# Patient Record
Sex: Female | Born: 1975 | Race: White | Hispanic: No | Marital: Married | Smoking: Former smoker
Health system: Southern US, Community
[De-identification: ages and names within clinical notes are randomized; demographics above are authoritative.]

## PROBLEM LIST (undated history)

## (undated) DIAGNOSIS — D689 Coagulation defect, unspecified: Secondary | ICD-10-CM

## (undated) DIAGNOSIS — B999 Unspecified infectious disease: Secondary | ICD-10-CM

## (undated) DIAGNOSIS — D6851 Activated protein C resistance: Secondary | ICD-10-CM

## (undated) DIAGNOSIS — R51 Headache: Secondary | ICD-10-CM

## (undated) DIAGNOSIS — D333 Benign neoplasm of cranial nerves: Secondary | ICD-10-CM

## (undated) DIAGNOSIS — J45909 Unspecified asthma, uncomplicated: Secondary | ICD-10-CM

## (undated) HISTORY — DX: Coagulation defect, unspecified: D68.9

## (undated) HISTORY — PX: CRANIECTOMY FOR EXCISION OF ACOUSTIC NEUROMA: SUR324

## (undated) HISTORY — DX: Unspecified asthma, uncomplicated: J45.909

## (undated) HISTORY — PX: ANTERIOR CRUCIATE LIGAMENT REPAIR: SHX115

## (undated) HISTORY — PX: BREAST BIOPSY: SHX20

---

## 2005-03-30 ENCOUNTER — Encounter: Payer: Self-pay | Admitting: Internal Medicine

## 2006-12-15 ENCOUNTER — Ambulatory Visit: Payer: Self-pay | Admitting: Internal Medicine

## 2007-02-02 ENCOUNTER — Ambulatory Visit: Payer: Self-pay | Admitting: Sports Medicine

## 2007-03-25 ENCOUNTER — Ambulatory Visit: Payer: Self-pay | Admitting: Sports Medicine

## 2007-03-25 DIAGNOSIS — M7750 Other enthesopathy of unspecified foot: Secondary | ICD-10-CM | POA: Insufficient documentation

## 2007-03-25 DIAGNOSIS — M2140 Flat foot [pes planus] (acquired), unspecified foot: Secondary | ICD-10-CM | POA: Insufficient documentation

## 2007-03-25 DIAGNOSIS — M2020 Hallux rigidus, unspecified foot: Secondary | ICD-10-CM

## 2007-04-26 ENCOUNTER — Other Ambulatory Visit: Admission: RE | Admit: 2007-04-26 | Discharge: 2007-04-26 | Payer: Self-pay | Admitting: Family Medicine

## 2008-05-09 ENCOUNTER — Other Ambulatory Visit: Admission: RE | Admit: 2008-05-09 | Discharge: 2008-05-09 | Payer: Self-pay | Admitting: Family Medicine

## 2008-05-09 ENCOUNTER — Encounter: Payer: Self-pay | Admitting: Internal Medicine

## 2008-05-24 DIAGNOSIS — K648 Other hemorrhoids: Secondary | ICD-10-CM | POA: Insufficient documentation

## 2008-05-24 DIAGNOSIS — J45909 Unspecified asthma, uncomplicated: Secondary | ICD-10-CM | POA: Insufficient documentation

## 2008-05-24 DIAGNOSIS — R51 Headache: Secondary | ICD-10-CM

## 2008-05-24 DIAGNOSIS — R519 Headache, unspecified: Secondary | ICD-10-CM | POA: Insufficient documentation

## 2008-05-24 DIAGNOSIS — D333 Benign neoplasm of cranial nerves: Secondary | ICD-10-CM

## 2008-05-24 DIAGNOSIS — K5909 Other constipation: Secondary | ICD-10-CM

## 2008-05-25 ENCOUNTER — Ambulatory Visit: Payer: Self-pay | Admitting: Internal Medicine

## 2008-08-13 ENCOUNTER — Ambulatory Visit (HOSPITAL_COMMUNITY): Admission: RE | Admit: 2008-08-13 | Discharge: 2008-08-13 | Payer: Self-pay | Admitting: Obstetrics and Gynecology

## 2008-09-16 ENCOUNTER — Inpatient Hospital Stay (HOSPITAL_COMMUNITY): Admission: AD | Admit: 2008-09-16 | Discharge: 2008-09-16 | Payer: Self-pay | Admitting: Obstetrics and Gynecology

## 2008-09-18 ENCOUNTER — Inpatient Hospital Stay (HOSPITAL_COMMUNITY): Admission: AD | Admit: 2008-09-18 | Discharge: 2008-09-18 | Payer: Self-pay | Admitting: Obstetrics and Gynecology

## 2008-11-08 ENCOUNTER — Ambulatory Visit (HOSPITAL_COMMUNITY): Admission: RE | Admit: 2008-11-08 | Discharge: 2008-11-08 | Payer: Self-pay | Admitting: Obstetrics and Gynecology

## 2008-11-12 ENCOUNTER — Ambulatory Visit (HOSPITAL_COMMUNITY): Admission: RE | Admit: 2008-11-12 | Discharge: 2008-11-12 | Payer: Self-pay | Admitting: Obstetrics and Gynecology

## 2008-11-15 ENCOUNTER — Encounter: Payer: Self-pay | Admitting: Internal Medicine

## 2009-08-19 ENCOUNTER — Inpatient Hospital Stay (HOSPITAL_COMMUNITY): Admission: AD | Admit: 2009-08-19 | Discharge: 2009-08-21 | Payer: Self-pay | Admitting: Obstetrics and Gynecology

## 2009-11-12 ENCOUNTER — Emergency Department (HOSPITAL_COMMUNITY): Admission: EM | Admit: 2009-11-12 | Discharge: 2009-11-12 | Payer: Self-pay | Admitting: Emergency Medicine

## 2009-11-26 ENCOUNTER — Ambulatory Visit (HOSPITAL_COMMUNITY): Admission: RE | Admit: 2009-11-26 | Discharge: 2009-11-26 | Payer: Self-pay | Admitting: General Surgery

## 2010-10-28 HISTORY — PX: HERNIA REPAIR: SHX51

## 2010-11-14 ENCOUNTER — Ambulatory Visit (HOSPITAL_COMMUNITY): Admission: RE | Admit: 2010-11-14 | Discharge: 2010-11-14 | Payer: Self-pay | Admitting: General Surgery

## 2010-12-28 NOTE — L&D Delivery Note (Signed)
Delivery Note  Upon arriving to birthing suites pt sat in bathtub and FHT's remained 130 with intermittent monitoring. Pt continued to progressed and had SROM with clear fluid with urge to push, pt ambulated to bed. Pushed well on her hands and knees, infant's head delivered and shoulders delivered easily with compound presentation of R hand. Infant was brought up to mom's chest with spontaneous cry. Cord was clamped after pulsating stopped and cut by FOB.  Routine cord blood and cord blood for donation was collected.     APGAR: 8, 9; weight 8 lb 9.7 oz (3904 g).   Placenta status: Intact, Spontaneous.  Cord: 3 vessels  Anesthesia: 1% lidocaine  Episiotomy: None Lacerations: 2nd degree;Perineal Suture Repair: 3.0 vicryl rapide Est. Blood Loss (mL): 250  Mom to postpartum.  Baby to nursery-stable.   Famous Eisenhardt M 11/06/2011, 4:13 AM
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
## 2011-03-10 LAB — BASIC METABOLIC PANEL
Chloride: 101 mEq/L (ref 96–112)
Creatinine, Ser: 0.66 mg/dL (ref 0.4–1.2)
Potassium: 4.1 mEq/L (ref 3.5–5.1)

## 2011-03-10 LAB — DIFFERENTIAL
Basophils Absolute: 0.1 10*3/uL (ref 0.0–0.1)
Eosinophils Absolute: 0.1 10*3/uL (ref 0.0–0.7)
Eosinophils Relative: 1 % (ref 0–5)
Lymphocytes Relative: 19 % (ref 12–46)
Lymphs Abs: 2 10*3/uL (ref 0.7–4.0)
Monocytes Absolute: 0.5 10*3/uL (ref 0.1–1.0)
Neutro Abs: 7.5 10*3/uL (ref 1.7–7.7)
Neutrophils Relative %: 74 % (ref 43–77)

## 2011-03-10 LAB — APTT: aPTT: 34 seconds (ref 24–37)

## 2011-03-10 LAB — CBC
HCT: 40.4 % (ref 36.0–46.0)
Hemoglobin: 14.2 g/dL (ref 12.0–15.0)
MCH: 32.2 pg (ref 26.0–34.0)
MCHC: 35.2 g/dL (ref 30.0–36.0)
Platelets: 223 10*3/uL (ref 150–400)
RDW: 12.9 % (ref 11.5–15.5)
WBC: 10.3 10*3/uL (ref 4.0–10.5)

## 2011-03-10 LAB — PREGNANCY, URINE: Preg Test, Ur: NEGATIVE

## 2011-03-10 LAB — PROTIME-INR
INR: 0.97 (ref 0.00–1.49)
Prothrombin Time: 13.1 seconds (ref 11.6–15.2)

## 2011-04-01 ENCOUNTER — Ambulatory Visit (HOSPITAL_COMMUNITY): Payer: Self-pay

## 2011-04-01 LAB — CBC
HCT: 38 % (ref 36.0–46.0)
MCHC: 33.2 g/dL (ref 30.0–36.0)
Platelets: 208 10*3/uL (ref 150–400)
WBC: 5.4 10*3/uL (ref 4.0–10.5)

## 2011-04-01 LAB — DIFFERENTIAL
Basophils Absolute: 0 10*3/uL (ref 0.0–0.1)
Basophils Relative: 1 % (ref 0–1)
Eosinophils Relative: 1 % (ref 0–5)
Monocytes Absolute: 0.4 10*3/uL (ref 0.1–1.0)
Neutro Abs: 3.2 10*3/uL (ref 1.7–7.7)
Neutrophils Relative %: 60 % (ref 43–77)

## 2011-04-01 LAB — BASIC METABOLIC PANEL
BUN: 11 mg/dL (ref 6–23)
CO2: 28 mEq/L (ref 19–32)
GFR calc Af Amer: >60 mL/min (ref >60)
GFR calc non Af Amer: >60 mL/min (ref >60)
Potassium: 3.7 mEq/L (ref 3.5–5.1)

## 2011-04-01 LAB — PROTIME-INR
INR: 0.97 (ref 0.00–1.49)
Prothrombin Time: 12.8 seconds (ref 11.6–15.2)

## 2011-04-02 ENCOUNTER — Ambulatory Visit (HOSPITAL_COMMUNITY)
Admission: RE | Admit: 2011-04-02 | Discharge: 2011-04-02 | Disposition: A | Discharge status: Home / Self Care | Payer: BC Managed Care – PPO | Source: Ambulatory Visit | Attending: Obstetrics and Gynecology | Admitting: Obstetrics and Gynecology

## 2011-04-04 LAB — PROTIME-INR
INR: 0.9 (ref 0.00–1.49)
Prothrombin Time: 11.8 seconds (ref 11.6–15.2)

## 2011-04-04 LAB — CBC
MCHC: 34 g/dL (ref 30.0–36.0)
MCHC: 34.3 g/dL (ref 30.0–36.0)
MCV: 92.5 fL (ref 78.0–100.0)
Platelets: 153 10*3/uL (ref 150–400)
RDW: 14.4 % (ref 11.5–15.5)

## 2011-05-12 NOTE — H&P (Signed)
Terri Cox, Terri Cox            ACCOUNT NO.:  [id number]   MEDICAL RECORD NO.:  [id number]          PATIENT TYPE:  INP   LOCATION:  9116                          FACILITY:  [facility]   PHYSICIAN:  Crist Fat. Rivard, M.D. DATE OF BIRTH:  February 11, 1976   DATE OF ADMISSION:  08/19/2009  DATE OF DISCHARGE:                              HISTORY & PHYSICAL   A 35 year old gravida 2, para 1, AB 1 with last menstrual period,  November 21, equals an EDD of August 26, 2009, presents to Labor and  Delivery complaining of contractions since 2:00 p.m., her water broke at  4:00 p.m., clear.  She also had a 16-week intrauterine fetal death that  resulted in a D and E by Dr. Su Hilt.   ADMISSION DIAGNOSES:  1. Intrauterine pregnancy at 39 weeks with spontaneous rupture of      membranes.  2. Positive GBS.  3. Factor V Leiden factor heterozygote  4. History of asthma.  5. 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  6. History of intrauterine fetal demise secondary to loss at 16 weeks.   HISTORY OF PRESENT ILLNESS:  Began care at [facility] at  January 11, 2009.  She has a history of Leiden factor V, is on Lovenox  40 mg subcu, was seen at [facility] in beginning of the  pregnancy, and she started on heparin August 09, 2009, 500 mEq subcu  nightly.  She had yeast infections in this pregnancy, a sinus infection  that was treated with Keflex.  Group B strep was positive, and we will  treat in Labor Lab.  She is A+, antibody screen negative, VDRL negative,  rubella immune.  HBsAg negative, GBS positive, AFP within normal limits.  GC and CT negative.   PAST MEDICAL HISTORY:  She is allergic to Benadryl.  History of candida  this pregnancy that was treated; blood disorder Leiden factor V, on  Lovenox beginning in the pregnancy, switched to heparin every 8 hours,  her last dose was at 2:00 p.m. this afternoon; asthma exercise-induced,  she uses  inhaler.  GI symptoms, she has a slow intestine and takes  MiraLax for her bowels, history of cystitis in the past.  Accident, she  had a torn left ACL with surgery, has acoustic neuromas on the right  side with hearing loss and drooping on the right face.   PAST SURGICAL HISTORY:  Torn ACL.   PAST OBSTETRICS AND GYNECOLOGIC HISTORY:  Menses began at 10 years,  lasting 3-5 days every 28 days.  In 2009, between 13 and 16 weeks, she  had an intrauterine fetal death with a D and E by Dr. Su Hilt, present  pregnancy.   FAMILY HISTORY:  Heart disease in maternal grandmother, hypertension in  maternal grandmother, maternal grandfather, paternal grandmother,  paternal grandfather, all have heart disease.  Thrombophlebitis in  paternal grandmother, paternal uncle, and her cousin has factor V.  Diabetes in maternal grandmother, is adult onset.  Chronic renal disease  in maternal grandfather.  Neuro discoid in maternal grandmother and  maternal grandfather had Alzheimer's.  Cancer, her mother had lung  cancer.  Paternal grandmother, paternal grandfather had colon cancer.  Father had skin cancer.   SOCIAL HISTORY:  She is married to Berdine Addison.  He has a BS degree  and works in receiving patient, has a Ph.D. and works as a Veterinary surgeon.  They are married.  Denies smoking, drugs, or alcohol.   PHYSICAL EXAMINATION:  LUNGS:  Clear bilaterally.  HEART:  Regular rate without murmur.  ABDOMEN:  Gravid.  Estimated fetal weight 7.5 to 8 pounds, palpated  contractions, has moderate breathing through the contractions using yoga  position.  VAGINA:  Per the nurse is 3 cm, -2 station vertex.  Contractions every 2-  3 minutes, fetal heart tone 140-150, accelerations noted, spontaneous  rupture of membranes, this was checked by the RN.   ASSESSMENT AND PLAN:  1. Intrauterine pregnancy at 39 weeks with spontaneous rupture of      membranes.  2. Group B positive, will need prophylaxis.  3. Factor V of  Leiden heterozygote, will need hose or compression      stockings or early ambulation.  4. May need to continue heparin as directed or switch to Lovenox.  5. Early labor.  6. The patient has a birth plan, is using birth ball.  She wants      little intervention as possible, understands that she needs to have      the group B strep prophylaxis, and state she will take the      medication, but would like to have the IV taken after the      medication runs in.      Jasmine Awe, CNM      Dois Davenport A. Rivard, M.D.  Electronically Signed    JM/MEDQ  D:  08/19/2009  T:  08/20/2009  Job:  [id number]

## 2011-05-12 NOTE — H&P (Signed)
[name], Terri Cox            ACCOUNT NO.:  [id number]   MEDICAL RECORD NO.:  [id number]          PATIENT TYPE:  MAT   LOCATION:  MATC                          [facility]ILITY:  [facility]   PHYSICIAN:  Crist Fat. Rivard, M.D. DATE OF BIRTH:  [birth year]   DATE OF ADMISSION:  09/16/2008  DATE OF DISCHARGE:                              HISTORY & PHYSICAL   DATE OF SURGERY:  Is tentatively scheduled for September 17, 2008.  It  may occur later in the week, however.   CHIEF COMPLAINT/HISTORY OF PRESENT ILLNESS:  Terri Cox is a 35-year-  old gravida 1, para 0 at 16-3/7 weeks who presented to maternity  admissions unit on the morning of September 16, 2008 complaining of some  brown spotting for 24 hours and no pain.  She had an 11-week ultrasound  at [facility].  She started care at [facility] and transferred to  [facility] at approximately 14 weeks.  She denies any recent  intercourse or trauma.   While in maternal admissions unit, she was noted to have a 13-week-3-day  fetal demise with oligohydramnios noted.  The patient did wish to  proceed with D&E after discussion of the options.  Dr. Su Hilt was  consulted as the on-call physician.  The preference was to schedule this  at the time of regular operating room scheduling secondary to the nature  of the second trimester D&E procedure.  The patient did wish to proceed  with this as quickly as possible on Monday September 21 if possible.  Dr. Su Hilt made the plan to consult with Dr. Estanislado Pandy as the on-call  physician for September 21 and the office is to call the patient on  September 21 to finalize those plans.   Pregnancy has been remarkable for:  1. A positive blood type.  2. History of removal of acoustic neuroma at age 35 requiring cranial      surgery with a hearing loss in the right ear and slight right      facial droop since  3. History of asthma but no recent medications.   PRENATAL LABS:  Blood type is A positive.  Rh  antibody action is listed  on her prenatal record as positive, but is actually negative.  Rubella  titer positive.  Hepatitis B surface antigen negative.  HIV nonreactive.  RPR is not noted on her prenatal record.  Cystic fibrosis testing is  negative.  TSH done at her first visit was 1.01.  Urinalysis was normal  at that time.  The GC and chlamydia were done on August 12.  No results  were noted on the patient's previous prenatal record.  She declined  first trimester screening.   HISTORY OF CURRENT PREGNANCY:  The patient presented for care at [facility] at approximately 5 weeks.  She had a visit with Dr. Richardson Dopp at 10 weeks  and 5 days.  She declined first trimester screening.  Physical exam was  done and cultures were done that day.  The patient reports the cultures  were negative and her pap was normal.  Those reports are not noted  on a  prenatal record.  She then subsequently has transferred to [facility] and had a visit there approximately 2 weeks ago. She  was to  come back in approximately 2 weeks for her 18-week ultrasound.  The  patient reports she started having some brown spotting yesterday.  She  denies any pain, recent intercourse, or trauma.   OBSTETRICAL HISTORY:  The patient is a primigravida.   MEDICAL HISTORY:  She is a previous birth control pill user.  She  reports the usual childhood illnesses.  She did have hepatitis A in  April 2009.  She does have a history of asthma.  She used albuterol  prior to pregnancy but has not required anything during pregnancy.   SURGICAL HISTORY:  Remarkable for cranial surgery at age 21 to remove an  acoustic neuroma.  She did have residual loss of hearing in her right  ear and a right facial droop from that surgery.  She also had knee  surgery on the left side with an ACL repair in the past.   ALLERGIES:  She has an allergy to BENADRYL.   FAMILY HISTORY:  Her maternal grandmother had heart disease.  Her  maternal  grandfather had varicosities.  Her maternal grandfather had  hypertension.  Her cousins have varicosities.  Her maternal grandmother  had a questionable Factor V deficiency in some uncles.  Maternal  grandmother had diabetes.  Maternal grandfather had chronic renal  disease.   GENETIC HISTORY:  Unremarkable.   SOCIAL HISTORY:  The patient is married to the father of the baby.  He  is involved and supportive.  His name is Terri Cox.  The patient is  Caucasian.  She denies a religious affiliation.  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  Her husband is employed with [employer].  She has been followed initially by  the nurse midwife service at [facility].  She denies any  alcohol, drug or tobacco use during this pregnancy.   PHYSICAL EXAM:  VITAL SIGNS:  Stable.  The patient is afebrile.  HEENT:  Within normal limits.  LUNGS:  Breath sounds are clear.  HEART:  Regular rate and rhythm without murmur.  BREASTS:  Soft and nontender.  ABDOMEN:  Fundal height is very small.  It is certainly tests than 14  weeks.  It is soft and nontender.  PELVIC:  Cervix is closed.  There is a small amount of brown discharge  in the vault.  No fetal heart tones were able to be auscultated.  EXTREMITIES:  Deep tendon reflexes are 2+ without clonus.  There is a  trace edema noted.   Ultrasound shows a 13-week-3-day intrauterine fetal demise  oligohydramnios.  Ovaries are within normal limits   ASSESSMENT:  1. A 13-week-3-day intrauterine fetal demise.  2. A positive type.   PLAN:  1. Findings were reviewed with the patient and her husband.  They did      wish to plan a D&E.  Dr. Su Hilt was notified.  Dr. Su Hilt      preferred to schedule during the regular OR time secondary to a      second trimester procedure.  The patient did request procedure on      September 21 if at all possible.  Dr. Su Hilt advised she would      discuss this request  of Dr. Estanislado Pandy, but it would be up to Dr.  Rivard's discretion.  A message was left with the surgery scheduler      at [facility] with all of this information and the patient's      telephone numbers.  She will call the patient on the morning of      September 21 after discussing the status with Dr. Estanislado Pandy.  2. The patient will be n.p.o. after midnight the morning of September      21.  Precautions were also reviewed for the patient to return if      she has increased bleeding or pain.  3. Support was provided to the patient for her loss and grief process      was initiated for the perinatal support process.  4. This preprocedure H&P is done for availability when the procedure      is to be scheduled.      Renaldo Reel Emilee Hero, C.N.M.      Crist Fat Rivard, M.D.  Electronically Signed    VLL/MEDQ  D:  09/16/2008  T:  09/16/2008  Job:  [id number]

## 2011-05-15 NOTE — Assessment & Plan Note (Signed)
[facility]                         GASTROENTEROLOGY OFFICE NOTE   NAME:Terri Cox, Terri Cox                     MRN:          [id number]  DATE:12/15/2006                            DOB:          March 21, 1976    Terri Cox is a 35 year old white female.  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  She brought with  her records from [place] from Dr. De Burrs, who is a gastroenterologist  who referred Terri Cox from [place], [place].  Last colonoscopy in  September, 2005.  Sitz mark transit study at that time was  normal.  Colonoscopy was normal.  She was diagnosed with chronic inertia and was  put on MiraLax 17 gm daily.  All of her blood tests were normal at that  time as well as white count of hemoglobin 13, hematocrit 36.3.  She  continues to use MiraLax on a daily basis and has bowel movements every  day to every three days.  She is quite comfortable taking MiraLax on a  daily basis.  There has been no upper GI symptoms, nausea, or vomiting,  only some belching.  Her weight has been stable or slightly increased.  She denies rectal bleeding.  There is a strong family history of colon  cancer in grandmother and a grandfather, who had colon cancer at the age  of 21.  Also, her father had colonic polyps.  Prior to taking MiraLax,  patient had a bowel movement about 2-3 times a month.   MEDICATIONS:  MiraLax 17 gm daily.  Advair, Proventil, Jasmine.  She  also takes Advil on a p.r.n. basis.   PAST MEDICAL HISTORY:  Significant for asthma, allergies, chronic  headaches, brain tumor, acoustic neuroma, knee surgery.   FAMILY HISTORY:  As mentioned in the history of present illness.   SOCIAL HISTORY:  She is married.  She has a Ph.D. as a psychotherapist.  She has no children.  She does not smoke.  Drinks occasionally.   REVIEW OF SYSTEMS:  Positive for eye glasses, allergies.   PHYSICAL EXAMINATION:  VITAL SIGNS:   Blood pressure 102/80, pulse 70.  Weight 122 pounds.  GENERAL:  She is alert and oriented in no distress.  SKIN:  Warm and dry.  HEENT:  Sclerae are anicteric.  No stigmata of chronic liver disease.  LUNGS:  Clear to auscultation.  COR:  Normal S1 and S2.  ABDOMEN:  Soft, flat.  Tattoos in right lower quadrant and left ankle.  Normoactive bowel sounds.  No tenderness.  No fullness.  Liver edge at  costal margin.  RECTAL:  Normal rectal tone.  Stool was hemoccult negative.   IMPRESSION:  26. A 35 year old white female who has chronic inertia resulting in      chronic constipation.  She is on a well-designed bowel regimen      combined with exercise three times a week and high-fiber diet,      using MiraLax 17 gm daily.  2. Positive family history of colon cancer in two grandparents and      family history of colon polyps in  her father.  The last colonoscopy      was 1-1/2 years ago.   PLAN:  I have discussed the constipation with the patient.  It appears  that she is on a good regimen at this time, so really do not need to  change anything.  I suggested Amitiza as a plan B.  She did try Zelnorm  in the past, but we do not have it available currently.  If we develop  any new pro-motility agents, she would be a candidate.  I would like to  see her again in one year.     Terri Cox. Juanda Chance, MD  Electronically Signed    DMB/MedQ  DD: 12/15/2006  DT: 12/15/2006  Job #: [id number]

## 2011-09-28 LAB — CBC
RBC: 3.98
WBC: 13.7 — ABNORMAL HIGH

## 2011-09-29 LAB — LUPUS ANTICOAGULANT PANEL
DRVVT: 34.1 — ABNORMAL LOW (ref 36.1–47.0)
Lupus Anticoagulant: NOT DETECTED

## 2011-09-29 LAB — CARDIOLIPIN ANTIBODIES, IGG, IGM, IGA
Anticardiolipin IgG: <7 — ABNORMAL LOW (ref <11)
Anticardiolipin IgM: <7 — ABNORMAL LOW (ref <10)

## 2011-09-29 LAB — BETA-2-GLYCOPROTEIN I ABS, IGG/M/A: Beta-2 Glyco I IgG: <4 U/mL (ref <20)

## 2011-09-29 LAB — PROTEIN C ACTIVITY: Protein C Activity: 112 % (ref 75–133)

## 2011-09-29 LAB — ANTITHROMBIN III: AntiThromb III Func: 96 (ref 76–126)

## 2011-09-29 LAB — HOMOCYSTEINE: Homocysteine: 6.6

## 2011-11-06 ENCOUNTER — Encounter (HOSPITAL_COMMUNITY): Payer: Self-pay | Admitting: *Deleted

## 2011-11-06 ENCOUNTER — Inpatient Hospital Stay (HOSPITAL_COMMUNITY)
Admission: AD | Admit: 2011-11-06 | Discharge: 2011-11-07 | DRG: 373 | Disposition: A | Discharge status: Home / Self Care | Payer: BC Managed Care – PPO | Source: Ambulatory Visit | Attending: Obstetrics and Gynecology | Admitting: Obstetrics and Gynecology

## 2011-11-06 DIAGNOSIS — Z3490 Encounter for supervision of normal pregnancy, unspecified, unspecified trimester: Secondary | ICD-10-CM

## 2011-11-06 DIAGNOSIS — D6859 Other primary thrombophilia: Secondary | ICD-10-CM | POA: Diagnosis present

## 2011-11-06 DIAGNOSIS — O09529 Supervision of elderly multigravida, unspecified trimester: Secondary | ICD-10-CM | POA: Diagnosis present

## 2011-11-06 DIAGNOSIS — O328XX0 Maternal care for other malpresentation of fetus, not applicable or unspecified: Secondary | ICD-10-CM | POA: Diagnosis present

## 2011-11-06 DIAGNOSIS — O9912 Other diseases of the blood and blood-forming organs and certain disorders involving the immune mechanism complicating childbirth: Principal | ICD-10-CM | POA: Diagnosis present

## 2011-11-06 DIAGNOSIS — D689 Coagulation defect, unspecified: Principal | ICD-10-CM | POA: Diagnosis present

## 2011-11-06 HISTORY — DX: Benign neoplasm of cranial nerves: D33.3

## 2011-11-06 HISTORY — DX: Activated protein C resistance: D68.51

## 2011-11-06 LAB — CBC
MCHC: 33.7 g/dL (ref 30.0–36.0)
MCV: 88.3 fL (ref 78.0–100.0)
Platelets: 182 10*3/uL (ref 150–400)
RDW: 14.2 % (ref 11.5–15.5)
WBC: 9.2 10*3/uL (ref 4.0–10.5)

## 2011-11-06 LAB — TYPE AND SCREEN

## 2011-11-06 MED ORDER — LACTATED RINGERS IV SOLN: 500.0000 mL | INTRAVENOUS | Status: DC | PRN

## 2011-11-06 MED ORDER — OXYTOCIN 10 UNIT/ML IJ SOLN
INTRAMUSCULAR | Status: AC
  Filled 2011-11-06: qty 2

## 2011-11-06 MED ORDER — SENNOSIDES-DOCUSATE SODIUM 8.6-50 MG PO TABS
2.0000 | ORAL_TABLET | Freq: Every day | ORAL | Status: DC
  Administered 2011-11-06: 2 via ORAL

## 2011-11-06 MED ORDER — ONDANSETRON HCL 4 MG/2ML IJ SOLN: 4.0000 mg | Freq: Four times a day (QID) | INTRAMUSCULAR | Status: DC | PRN

## 2011-11-06 MED ORDER — OXYTOCIN 10 UNIT/ML IJ SOLN: 10.0000 [IU] | Freq: Once | INTRAMUSCULAR | Status: DC

## 2011-11-06 MED ORDER — ACETAMINOPHEN 325 MG PO TABS
650.0000 mg | ORAL_TABLET | ORAL | Status: DC | PRN
  Administered 2011-11-06: 650 mg via ORAL
  Filled 2011-11-06: qty 2

## 2011-11-06 MED ORDER — LANOLIN HYDROUS EX OINT: TOPICAL_OINTMENT | CUTANEOUS | Status: DC | PRN

## 2011-11-06 MED ORDER — TETANUS-DIPHTH-ACELL PERTUSSIS 5-2.5-18.5 LF-MCG/0.5 IM SUSP
0.5000 mL | Freq: Once | INTRAMUSCULAR | Status: DC
  Filled 2011-11-06: qty 0.5

## 2011-11-06 MED ORDER — DIBUCAINE 1 % RE OINT: 1.0000 "application " | TOPICAL_OINTMENT | RECTAL | Status: DC | PRN

## 2011-11-06 MED ORDER — OXYCODONE-ACETAMINOPHEN 5-325 MG PO TABS: 1.0000 | ORAL_TABLET | ORAL | Status: DC | PRN

## 2011-11-06 MED ORDER — WITCH HAZEL-GLYCERIN EX PADS: 1.0000 "application " | MEDICATED_PAD | CUTANEOUS | Status: DC | PRN

## 2011-11-06 MED ORDER — ONDANSETRON HCL 4 MG/2ML IJ SOLN: 4.0000 mg | INTRAMUSCULAR | Status: DC | PRN

## 2011-11-06 MED ORDER — ALBUTEROL SULFATE HFA 108 (90 BASE) MCG/ACT IN AERS: 2.0000 | INHALATION_SPRAY | Freq: Four times a day (QID) | RESPIRATORY_TRACT | Status: DC | PRN

## 2011-11-06 MED ORDER — SIMETHICONE 80 MG PO CHEW: 80.0000 mg | CHEWABLE_TABLET | ORAL | Status: DC | PRN

## 2011-11-06 MED ORDER — IBUPROFEN 600 MG PO TABS
600.0000 mg | ORAL_TABLET | Freq: Four times a day (QID) | ORAL | Status: DC
  Administered 2011-11-06 – 2011-11-07 (×5): 600 mg via ORAL
  Filled 2011-11-06 (×5): qty 1

## 2011-11-06 MED ORDER — CITRIC ACID-SODIUM CITRATE 334-500 MG/5ML PO SOLN: 30.0000 mL | ORAL | Status: DC | PRN

## 2011-11-06 MED ORDER — OXYCODONE-ACETAMINOPHEN 5-325 MG PO TABS: 2.0000 | ORAL_TABLET | ORAL | Status: DC | PRN

## 2011-11-06 MED ORDER — BENZOCAINE-MENTHOL 20-0.5 % EX AERO
1.0000 "application " | INHALATION_SPRAY | CUTANEOUS | Status: DC | PRN
  Administered 2011-11-06: 1 via TOPICAL

## 2011-11-06 MED ORDER — OXYTOCIN BOLUS FROM INFUSION
500.0000 mL | Freq: Once | INTRAVENOUS | Status: DC
  Filled 2011-11-06: qty 500

## 2011-11-06 MED ORDER — PRENATAL PLUS 27-1 MG PO TABS
1.0000 | ORAL_TABLET | Freq: Every day | ORAL | Status: DC
  Administered 2011-11-06 – 2011-11-07 (×2): 1 via ORAL
  Filled 2011-11-06 (×2): qty 1

## 2011-11-06 MED ORDER — ZOLPIDEM TARTRATE 5 MG PO TABS: 5.0000 mg | ORAL_TABLET | Freq: Every evening | ORAL | Status: DC | PRN

## 2011-11-06 MED ORDER — SODIUM CHLORIDE 0.9 % IJ SOLN: 3.0000 mL | Freq: Two times a day (BID) | INTRAMUSCULAR | Status: DC

## 2011-11-06 MED ORDER — ENOXAPARIN SODIUM 40 MG/0.4ML ~~LOC~~ SOLN
40.0000 mg | Freq: Every day | SUBCUTANEOUS | Status: DC
  Administered 2011-11-06: 40 mg via SUBCUTANEOUS
  Filled 2011-11-06 (×2): qty 0.4

## 2011-11-06 MED ORDER — ACETAMINOPHEN 325 MG PO TABS: 650.0000 mg | ORAL_TABLET | Freq: Four times a day (QID) | ORAL | Status: DC | PRN

## 2011-11-06 MED ORDER — ENOXAPARIN SODIUM 40 MG/0.4ML ~~LOC~~ SOLN: 40.0000 mg | SUBCUTANEOUS | Status: DC

## 2011-11-06 MED ORDER — ONDANSETRON HCL 4 MG PO TABS: 4.0000 mg | ORAL_TABLET | ORAL | Status: DC | PRN

## 2011-11-06 MED ORDER — OXYTOCIN 20 UNITS IN LACTATED RINGERS INFUSION - SIMPLE: 125.0000 mL/h | Freq: Once | INTRAVENOUS | Status: DC

## 2011-11-06 MED ORDER — BENZOCAINE-MENTHOL 20-0.5 % EX AERO
INHALATION_SPRAY | CUTANEOUS | Status: AC
  Administered 2011-11-06: 1 via TOPICAL
  Filled 2011-11-06: qty 56

## 2011-11-06 MED ORDER — ENOXAPARIN SODIUM 40 MG/0.4ML ~~LOC~~ SOLN
40.0000 mg | SUBCUTANEOUS | Status: DC
  Filled 2011-11-06: qty 0.4

## 2011-11-06 MED ORDER — IBUPROFEN 600 MG PO TABS: 600.0000 mg | ORAL_TABLET | Freq: Four times a day (QID) | ORAL | Status: DC | PRN

## 2011-11-06 MED ORDER — LIDOCAINE HCL (PF) 1 % IJ SOLN
30.0000 mL | INTRAMUSCULAR | Status: DC | PRN
  Administered 2011-11-06: 30 mL via SUBCUTANEOUS
  Filled 2011-11-06: qty 30

## 2011-11-06 NOTE — Progress Notes (Signed)
UR Chart review completed.  

## 2011-11-06 NOTE — Progress Notes (Signed)
See MAR.

## 2011-11-06 NOTE — Progress Notes (Signed)
VE ON Monday  3 CM.  HURT BAD AT MN.

## 2011-11-06 NOTE — Progress Notes (Signed)
Out of bath to void. FHT taken. Back to bath.

## 2011-11-06 NOTE — Progress Notes (Signed)
Post Partum Day 0 Subjective: no complaints.  Up ad lib, breastfeeding going well. Anette Guarneri, LC, at bedside.  Patient does report significant cramping with feedings.  Desires d/c tomorrow.    Objective: Blood pressure 114/65, pulse 79, temperature 98.4 F (36.9 C), temperature source Oral, resp. rate 18, height 5\' 4"  (1.626 m), weight 68.04 kg (150 lb), SpO2 97.00%, unknown if currently breastfeeding.  Physical Exam:  General: alert Lochia: appropriate, no clots. Uterine Fundus: firm Incision: NA DVT Evaluation: No evidence of DVT seen on physical exam. Negative Homan's sign. No cords or calf tenderness. No significant calf/ankle edema.   Basename 11/06/11 0142  HGB 12.7  HCT 37.7    Assessment/Plan: Plan for discharge tomorrow Lovenox 40 mg SQ to begin this pm Per consult with Pharmacy, Motrin is not contraindicated with Lovenox, but could increase bleeding.  Patient understands this, prefers Motrin use to Percocet.   LOS: 0 days   Becky Berberian 11/06/2011, 10:02 AM

## 2011-11-06 NOTE — H&P (Signed)
Terri Cox is a 35 y.o. female presenting for labor check, breathing through ctx, doula and husband at bs. States they've been going on since this afternoon but stronger since 11, denies LOF, some bloody show, +FM.  Pt is positive for heterozygous factor V leiden, has been on lovenox during pregnancy and heparin since 36wks, last dose was at 2pm.  Pt has hx of 16wk loss.  Hx acoustic neuroma - r ear deafness Previous smoker  Maternal Medical History:  Reason for admission: Reason for admission: contractions.  Contractions: Onset was 3-5 hours ago.   Frequency: regular.   Perceived severity is strong.    Fetal activity: Perceived fetal activity is normal.   Last perceived fetal movement was within the past hour.    Prenatal complications: no prenatal complications Heterozygous factor V leiden    OB History    Grav Para Term Preterm Abortions TAB SAB Ect Mult Living   4    1  1   1      No past medical history on file. ex induced asthma, factor V leiden,  No past surgical history on file. acoustic neuroma, torn ACL Family History: family history is not on file.  Heart dx - MGM CHTN - mgm, mgf, pgm, pgf  Social History:  does not have a smoking history on file. She does not have any smokeless tobacco history on file. Her alcohol and drug histories not on file. 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    Dilation: 7.5 Effacement (%): 100 Exam by:: S.Lovinia Snare,CNM Blood pressure 136/85, pulse 114, temperature 97.9 F (36.6 C), temperature source Oral, resp. rate 22. Maternal Exam:  Uterine Assessment: Contraction strength is firm.  Contraction duration is 60 seconds. Contraction frequency is regular.   Abdomen: Patient reports no abdominal tenderness. Fundal height is aga.   Estimated fetal weight is 7-8#.   Fetal presentation:  vertex  Introitus: Normal vulva. Normal vagina.  Pelvis: adequate for delivery.   Cervix: Cervix evaluated by digital exam.     Fetal Exam Fetal Monitor Review: Mode: ultrasound.   Baseline rate: 130.  Variability: moderate (6-25 bpm).   Pattern: accelerations present and no decelerations.    Fetal State Assessment: Category I - tracings are normal.     Physical Exam  Constitutional: She is oriented to person, place, and time. She appears well-developed and well-nourished.  Neck: Normal range of motion.  Cardiovascular: Normal rate.   Respiratory: Effort normal.  GI: Soft.  Genitourinary: Vagina normal.  Musculoskeletal: Normal range of motion.  Neurological: She is alert and oriented to person, place, and time.  Skin: Skin is warm and dry.  Psychiatric: She has a normal mood and affect. Her behavior is normal.    Prenatal labs: ABO, Rh:  a pos Antibody:  neg Rubella:  IMM RPR:   NR HBsAg:   Neg HIV:   neg GBS:   neg 1st trimester screen neg AFP neg 1hr gtt wnl   Assessment/Plan: IUP at [redacted]w[redacted]d  Active/transition labor Desires CNM care, no medications Pos factor V leiden - on heparin last dose at 2pm Neg GBS FHR reassuring  Admit to birthing suites - per dr Normand Sloop, CNM may attend delivery Saline lock Intermittent monitoring   Sol Englert M 11/06/2011, 1:38 AM

## 2011-11-06 NOTE — Progress Notes (Signed)
Discussed with Dr. Normand Sloop, plan for DVT prophylaxis post-partum  Will begin lovenox 40units SQ daily starting 11/07/11

## 2011-11-06 NOTE — Progress Notes (Signed)
Pt requested monitor not be applied. Held to abd to assess FHT. Midwife agreed with pts request to stop.

## 2011-11-07 LAB — CBC
MCHC: 33 g/dL (ref 30.0–36.0)
Platelets: 171 10*3/uL (ref 150–400)
RDW: 14.2 % (ref 11.5–15.5)

## 2011-11-07 MED ORDER — OXYCODONE-ACETAMINOPHEN 5-325 MG PO TABS: 1.0000 | ORAL_TABLET | ORAL | Status: AC | PRN

## 2011-11-07 MED ORDER — IBUPROFEN 600 MG PO TABS: 600.0000 mg | ORAL_TABLET | Freq: Four times a day (QID) | ORAL | Status: AC

## 2011-11-07 MED ORDER — ENOXAPARIN SODIUM 40 MG/0.4ML ~~LOC~~ SOLN: 40.0000 mg | Freq: Every day | SUBCUTANEOUS | Status: DC

## 2011-11-07 MED ORDER — NORETHINDRONE 0.35 MG PO TABS: 1.0000 | ORAL_TABLET | Freq: Every day | ORAL | Status: DC

## 2011-11-07 NOTE — Discharge Summary (Signed)
After RN reviewed patient discharge instructions and gave prescriptions (provided by MD). Patient verbalized understanding. Patient had no complaints at time of discharge and was walked off the unit by nurse tech.

## 2011-11-07 NOTE — Progress Notes (Signed)
  Post Partum Day 1 Subjective: no complaints, up ad lib without syncope, voiding, tolerating PO, + flatus, no BM Pain well controlled with po meds BF well Mood stable, bonding well Desires micronor for [facility]  Objective: Blood pressure 106/70, pulse 70, temperature 98.1 F (36.7 C), temperature source Oral, resp. rate 18, height 5\' 4"  (1.626 m), weight 68.04 kg (150 lb), SpO2 96.00%, unknown if currently breastfeeding.  Physical Exam:  General: alert and no distress Lungs: CTAB Heart: RRR Breasts:soft, filling, nipples intact Lochia: appropriate Uterine Fundus: firm Perineum: healing well DVT Evaluation: No evidence of DVT seen on physical exam. Negative Homan's sign. No significant calf/ankle edema.   Basename 11/07/11 0516 11/06/11 0142  HGB 11.4* 12.7  HCT 34.5* 37.7    Assessment/Plan: Discharge home, Breastfeeding, Lactation consult and Contraception micronor Pt desires early D/C Stable PP Has had flu vac and TDAP Meds: lovenox 40mg  SQ daily x6wks Motrin, percocet, micronor      LOS: 1 day   Terri Cox M 11/07/2011, 9:48 AM

## 2011-11-17 NOTE — Discharge Summary (Signed)
   Obstetric Discharge Summary Reason for Admission: onset of labor Prenatal Procedures: ultrasound Intrapartum Procedures: spontaneous vaginal delivery Postpartum Procedures: none Complications-Operative and Postpartum: none    Hemoglobin  Date Value Range Status  11/07/2011 11.4* 12.0-15.0 (g/dL) Final     HCT  Date Value Range Status  11/07/2011 34.5* 36.0-46.0 (%) Final    Hospital Course:  Pt arrived in active labor, SVD without complications, routine PP course.   Discharge Diagnoses: Term Pregnancy-delivered  Discharge Information: Date: 11/17/2011 Activity: pelvic rest Diet: routine Medications:  Medication List  As of 11/17/2011  4:16 AM   START taking these medications         enoxaparin 40 MG/0.4ML Soln   Commonly known as: LOVENOX   Inject 0.4 mLs (40 mg total) into the skin daily at 3 pm.      ibuprofen 600 MG tablet   Commonly known as: ADVIL,MOTRIN   Take 1 tablet (600 mg total) by mouth every 6 (six) hours.      norethindrone 0.35 MG tablet   Commonly known as: MICRONOR,CAMILA,ERRIN   Take 1 tablet (0.35 mg total) by mouth daily.   Start taking on: 11/21/2011      oxyCODONE-acetaminophen 5-325 MG per tablet   Commonly known as: PERCOCET   Take 1 tablet by mouth every 4 (four) hours as needed (moderate - severe pain).         CONTINUE taking these medications         albuterol 108 (90 BASE) MCG/ACT inhaler   Commonly known as: PROVENTIL HFA;VENTOLIN HFA      prenatal vitamin w/FE, FA 27-1 MG Tabs         STOP taking these medications         heparin 5000 UNIT/ML injection          Where to get your medications    These are the prescriptions that you need to pick up.   You may get these medications from any pharmacy.         enoxaparin 40 MG/0.4ML Soln   ibuprofen 600 MG tablet   norethindrone 0.35 MG tablet   oxyCODONE-acetaminophen 5-325 MG per tablet           Condition: stable Instructions: refer to practice specific  booklet Discharge to: home Follow-up Information    Follow up with Nathanael Krist M, CNM in 5 weeks. (As needed if symptoms worsen)    Contact information:   [address] [place] [address] [phone]          Newborn Data: Live born  Information for the patient's newborn:  Marta Lamas [[id number]]  female ; APGAR , 8, 9 ; weight ; 8#10oz Home with mother.  Bion Todorov M 11/17/2011, 4:16 AM

## 2012-06-28 ENCOUNTER — Telehealth: Payer: Self-pay

## 2012-06-28 NOTE — Telephone Encounter (Signed)
Called 1 RF of Camila tabs 0.35 mg disp 1 pk. Sig : 1 po qd. Pt's last pap was 10/2010, so pt will need exam with pap prior to further RF's. She had PP exam , but no pap. Melody Comas A

## 2012-06-29 ENCOUNTER — Telehealth: Payer: Self-pay

## 2012-06-29 NOTE — Telephone Encounter (Signed)
Received fax request from [facility] for pt's BC. Pt has refills on Ortho Micronor good through 11/13 Liborio Nixon from [facility] confirmed.

## 2012-07-05 ENCOUNTER — Telehealth: Payer: Self-pay | Admitting: Obstetrics and Gynecology

## 2012-07-05 NOTE — Telephone Encounter (Signed)
Tyvonna/return call 

## 2012-07-08 ENCOUNTER — Encounter (INDEPENDENT_AMBULATORY_CARE_PROVIDER_SITE_OTHER): Payer: Self-pay | Admitting: General Surgery

## 2012-07-08 ENCOUNTER — Ambulatory Visit (INDEPENDENT_AMBULATORY_CARE_PROVIDER_SITE_OTHER): Payer: BC Managed Care – PPO | Admitting: General Surgery

## 2012-07-08 VITALS — BP 112/74 | HR 68 | Temp 98.5°F | Resp 12 | Ht 64.0 in | Wt 124.8 lb

## 2012-07-08 DIAGNOSIS — M6200 Separation of muscle (nontraumatic), unspecified site: Secondary | ICD-10-CM

## 2012-07-08 DIAGNOSIS — M6208 Separation of muscle (nontraumatic), other site: Secondary | ICD-10-CM

## 2012-07-08 NOTE — Progress Notes (Signed)
Subjective:     Patient ID: Terri Cox, female   DOB: [birth year], 36 y.o.   MRN: [id number]  HPI 36 year old Caucasian female comes in because she is concerned she may have a hernia recurrence. She underwent an open repair of an umbilical hernia with mesh in November 2011. Her fascial defect was around 1 cm. We used a 4.3 cm proceed ventral mesh. Since she was last seen, she is delivered a child. Her son is 55 months old She noticed a small bulge above her bellybutton for the past several months. It causes some intermittent discomfort. The discomfort is generally after eating. She denies any fevers or chills. She denies any nausea vomiting. She has a bowel movement at least every other day.  PMHx, PSHx, SOCHx, FAMHx, ALL reviewed and unchanged   Review of Systems     Objective:   Physical Exam BP 112/74  Pulse 68  Temp 98.5 F (36.9 C) (Temporal)  Resp 12  Ht 5\' 4"  (1.626 m)  Wt 124 lb 12.8 oz (56.609 kg)  BMI 21.42 kg/m2 Alert, no apparent distress Nonfocal Abdomen-soft, nontender, nondistended. Well-healed infraumbilical incision. No obvious hernia recurrence. With the patient standing, there is a small bulge above the level of her umbilicus. It is about 2 cm x 2 cm. I do not appreciate any fascial defects. It is more consistent with diastasis    Assessment:     diastasis    Plan:     I do not believe she has had hernia recurrence. I believe the abdominal muscle has thinned out causing a diastasis. I explained that I cannot be 100% positive but I was fairly confident with my exam. We discussed the possibility of a CT scan. However, we both agreed to keep a eye on this area. We discussed core exercises to increase her abdominal wall muscles. F/u prn  Mary Sella. Andrey Campanile, MD, FACS General, Bariatric, & Minimally Invasive Surgery [facility], [place]

## 2012-07-12 ENCOUNTER — Telehealth: Payer: Self-pay | Admitting: Obstetrics and Gynecology

## 2012-07-12 NOTE — Telephone Encounter (Signed)
Tyvonna/call bck

## 2012-07-21 ENCOUNTER — Ambulatory Visit (INDEPENDENT_AMBULATORY_CARE_PROVIDER_SITE_OTHER): Payer: BC Managed Care – PPO | Admitting: Sports Medicine

## 2012-07-21 VITALS — BP 112/75

## 2012-07-21 DIAGNOSIS — M7632 Iliotibial band syndrome, left leg: Secondary | ICD-10-CM

## 2012-07-21 DIAGNOSIS — M629 Disorder of muscle, unspecified: Secondary | ICD-10-CM

## 2012-07-21 DIAGNOSIS — M2020 Hallux rigidus, unspecified foot: Secondary | ICD-10-CM

## 2012-07-21 NOTE — Assessment & Plan Note (Signed)
Work strength of hip abductors  Stretches  Orthotics adjusted  Running gait looks good  Reck 6 wks if not reoslving

## 2012-07-21 NOTE — Assessment & Plan Note (Signed)
Adjustments made to orthotics  Hopefully this will take pressure off of great toe and lessen forefoot pronation  Additional cushion added to heel as well

## 2012-07-21 NOTE — Patient Instructions (Addendum)
It has been a pleasure to see you today. Your orthotics has been fixed. Try to do the set of exercises given as recommended to strengthen your hip/thigh muscles for 4-6 weeks Make your next appointment as needed.

## 2012-07-21 NOTE — Progress Notes (Signed)
  Subjective:    Patient ID: Terri Cox, female    DOB: March 01, 1976, 36 y.o.   MRN: [id number]  HPI Pt that comes today to get evaluated for left great toe and knee pain. She had similar symptoms on her right toe 4 years ago that was fixed with orthotics. She brings her 36 y/o orthotics in order to replace it if needed. 1. Left great toe: Pain that started 2 weeks ago. It is located on 1st phalanx and is painful only while applying pressure mostly while bare feet. She runs a couple of miles a week but the pain does not exacerbate with running. No irradiation. Pain does not wake her up at night.  2. Lateral side of distal thigh: pain with exercise, mostly when running and going up or down stairs. She has hx of left  MCL and ACL repair on 1997 after a skiing accident.  Pain does not irradiate.  She does not recall trauma on the affected areas. Pt has not taken any therapy for pain relieve.  Review of Systems Per HPI    Objective:   Physical Exam Constitutional: NAD, very pleasant. HEENT: complete right facial droop. Moist mucosa.  MSK: Left great toe: tenderness to palpation on inferior aspect of first proximal phalanx. Calluses present on distal great toe and first metatarsal distal head. Normal ROM and strength. Normal pedal pulses, no edema or erythema.            Left Knee/distal thigh: healed scar on skin above patella and lateral side of distal thigh. Tenderness to palpation right above the area where scar is. No erythema, or edema. Normal knee passive and active ROM.  No tenderness on joint line. No joint laxity.   Weakness in hip abduction on left  Gait corrected after orthotic inserts fixed.      Assessment & Plan:

## 2013-07-19 LAB — OB RESULTS CONSOLE ANTIBODY SCREEN: Antibody Screen: POSITIVE

## 2013-07-25 ENCOUNTER — Ambulatory Visit (HOSPITAL_COMMUNITY)
Admission: RE | Admit: 2013-07-25 | Discharge: 2013-07-25 | Disposition: A | Discharge status: Home / Self Care | Payer: BC Managed Care – PPO | Source: Ambulatory Visit | Attending: Obstetrics and Gynecology | Admitting: Obstetrics and Gynecology

## 2013-07-25 ENCOUNTER — Encounter (HOSPITAL_COMMUNITY): Payer: Self-pay

## 2013-07-25 VITALS — BP 107/72 | HR 75 | Wt 132.0 lb

## 2013-07-25 DIAGNOSIS — D6859 Other primary thrombophilia: Secondary | ICD-10-CM | POA: Insufficient documentation

## 2013-07-25 DIAGNOSIS — D689 Coagulation defect, unspecified: Secondary | ICD-10-CM | POA: Insufficient documentation

## 2013-07-25 DIAGNOSIS — O09529 Supervision of elderly multigravida, unspecified trimester: Secondary | ICD-10-CM | POA: Insufficient documentation

## 2013-07-25 DIAGNOSIS — D6851 Activated protein C resistance: Secondary | ICD-10-CM | POA: Insufficient documentation

## 2013-07-25 NOTE — Consult Note (Signed)
Maternal Fetal Medicine Consultation  Requesting Provider(s): Nigel Bridgeman, CNM  Reason for consultation: Factor V Leiden heterozygous - ? Lovenox  HPI: Terri Cox is a 37 yo [id number] currently at 7 1/7 weeks, EDD 03/12/2014 who was seen today for consultation due to a personal history of Factor V Leiden heterozygous deficiency.  She reports that she was treated with Lovenox as well as baby Asprin in her previous pregnancy.  Terri Cox denies any personal history of thromboembolism.  Her paternal uncle experienced several DVTs.  Her father died of an MI, but she believes he may have died from a pulmonary embolus.  Terri Cox reports a history of a 16 week IUFD as well as 2 previous term deliveries without complications. As part of her work up of her previous 16 week loss, a thrombophilia work up was completed - Lupus anticoagulant, Anticardiolipin antibodies and beta2 glycoprotein antibodies were negative.  She is without complaints today.  OB History: OB History   Grav Para Term Preterm Abortions TAB SAB Ect Mult Living   4 2 2  1  1   2    16  week IUFD  PMH:  Past Medical History  Diagnosis Date  . Factor 5 Leiden mutation, heterozygous     on heparin  . Acoustic neuroma     hx of  . Asthma   . 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 Anterior cruciate ligament repair    . Craniectomy for excision of acoustic neuroma    . Hernia repair  10/2010    umb hernia   Meds:  Baby ASA, PNV, Folate, prn albuterol  Allergies:  Allergies  Allergen Reactions  . Diphenhydramine Hcl Hives   FH: denies birth defects or hereditary disorders  Soc: 2-3 drinks/ week prior to pregnancy; none since conception.  Denies tobacco use  PE:   Filed Vitals:   07/25/13 0824  BP: 107/72  Pulse: 75    GEN: well-appearing female ABD: gravid, NT   A/P: 1) Single IUP at 7 1/7  weeks         2) Heterozygous for factor V Leiden         3) Advanced maternal age - plans to have cell free fetal DNA testing  Recommendations:  I reviewed the current ACOG recommendations for the use of Lovenox in women who are Factor V Leiden heterozygous with Terri Cox.  In the absence of a prior history of thromboembolism, routine Lovenox prophylaxis is not recommended unless other risk factors develop (i.e. Prolonged bedrest, surgery).  Given her family history of thromboembolism, some would recommend Lovenox for 6 weeks post partum. Terri Cox feels that her 16 week loss was in part due to Factor V Leiden mutation.  Current literature does not support Factor V Leiden as a cause for pregnancy loss.  Nevertheless, based on her previous counseling and treatment, she strongly desires to go back on Lovenox.  We briefly reviewed the risks and benefits of the medication - although there is no strong indication for Lovenox, feel that it would be reasonable to administer it at a prophylactic dose (40 mg daily) due in part to at least some increased risk for thromboembolism.     Thank you for the opportunity to be a part of the care of Terri Cox. Please contact our office if we can be of further assistance.  I spent approximately 30 minutes with this patient with over 50% of time spent in face-to-face counseling.  Alpha GulaPaul Shalayah Beagley, MD Maternal Fetal Medicine

## 2013-07-27 NOTE — Addendum Note (Signed)
Encounter addended by: Ty Hilts, RN on: 07/27/2013  3:57 PM<BR>     Documentation filed: Charges VN

## 2013-12-28 NOTE — L&D Delivery Note (Signed)
Delivery Note At 5:45 PM a viable female "Terri Cox" was delivered via Vaginal, Spontaneous Delivery (Presentation: Right Occiput Anterior).  APGAR: 7, 8; weight pending.  Cord quickly double clamped and cut by FOB.  Baby seemed to be doing well but slow to pink up, nursery RN called and pulse ox placed on baby.  Baby sat in the 38s, nursery RN took baby to nursery for further obs.  FOB went with baby to give baby skin to skin during obs. Placenta status: Intact, Spontaneous.  Cord: 3 vessels with the following complications: None.  Cord pH: not collected.   Anesthesia: None  Episiotomy: None Lacerations: 1st degree;Perineal Suture Repair: 3.0 vicryl Est. Blood Loss (mL): 350  Mom to postpartum.  Baby to Nursery.  Routine PP orders Breastfeeding   Terri Cox, Terri Cox 03/03/2014, 6:45 PM

## 2014-02-10 LAB — OB RESULTS CONSOLE GBS: STREP GROUP B AG: NEGATIVE

## 2014-03-03 ENCOUNTER — Inpatient Hospital Stay (HOSPITAL_COMMUNITY)
Admission: AD | Admit: 2014-03-03 | Discharge: 2014-03-05 | DRG: 775 | Disposition: A | Discharge status: Home / Self Care | Payer: BC Managed Care – PPO | Source: Ambulatory Visit | Attending: Obstetrics and Gynecology | Admitting: Obstetrics and Gynecology

## 2014-03-03 ENCOUNTER — Encounter (HOSPITAL_COMMUNITY): Payer: Self-pay | Admitting: *Deleted

## 2014-03-03 DIAGNOSIS — O99344 Other mental disorders complicating childbirth: Secondary | ICD-10-CM | POA: Diagnosis present

## 2014-03-03 DIAGNOSIS — D6859 Other primary thrombophilia: Secondary | ICD-10-CM | POA: Diagnosis present

## 2014-03-03 DIAGNOSIS — O9912 Other diseases of the blood and blood-forming organs and certain disorders involving the immune mechanism complicating childbirth: Secondary | ICD-10-CM

## 2014-03-03 DIAGNOSIS — O4290 Premature rupture of membranes, unspecified as to length of time between rupture and onset of labor, unspecified weeks of gestation: Principal | ICD-10-CM | POA: Diagnosis present

## 2014-03-03 DIAGNOSIS — O99119 Other diseases of the blood and blood-forming organs and certain disorders involving the immune mechanism complicating pregnancy, unspecified trimester: Secondary | ICD-10-CM

## 2014-03-03 DIAGNOSIS — O2650 Maternal hypotension syndrome, unspecified trimester: Secondary | ICD-10-CM | POA: Diagnosis not present

## 2014-03-03 DIAGNOSIS — D689 Coagulation defect, unspecified: Secondary | ICD-10-CM | POA: Diagnosis present

## 2014-03-03 DIAGNOSIS — F3289 Other specified depressive episodes: Secondary | ICD-10-CM | POA: Diagnosis present

## 2014-03-03 DIAGNOSIS — IMO0002 Reserved for concepts with insufficient information to code with codable children: Secondary | ICD-10-CM | POA: Diagnosis present

## 2014-03-03 DIAGNOSIS — F329 Major depressive disorder, single episode, unspecified: Secondary | ICD-10-CM | POA: Diagnosis present

## 2014-03-03 DIAGNOSIS — D6851 Activated protein C resistance: Secondary | ICD-10-CM | POA: Diagnosis present

## 2014-03-03 DIAGNOSIS — O09529 Supervision of elderly multigravida, unspecified trimester: Secondary | ICD-10-CM | POA: Diagnosis present

## 2014-03-03 HISTORY — DX: Unspecified infectious disease: B99.9

## 2014-03-03 HISTORY — DX: Headache: R51

## 2014-03-03 LAB — OB RESULTS CONSOLE GC/CHLAMYDIA
CHLAMYDIA, DNA PROBE: NEGATIVE
Gonorrhea: NEGATIVE

## 2014-03-03 LAB — CBC
HEMATOCRIT: 38.4 % (ref 36.0–46.0)
Hemoglobin: 13.1 g/dL (ref 12.0–15.0)
MCH: 30 pg (ref 26.0–34.0)
MCHC: 34.1 g/dL (ref 30.0–36.0)
MCV: 88.1 fL (ref 78.0–100.0)
Platelets: 187 10*3/uL (ref 150–400)
RBC: 4.36 MIL/uL (ref 3.87–5.11)
RDW: 14.6 % (ref 11.5–15.5)
WBC: 11.7 10*3/uL — AB (ref 4.0–10.5)

## 2014-03-03 LAB — OB RESULTS CONSOLE HIV ANTIBODY (ROUTINE TESTING): HIV: NONREACTIVE

## 2014-03-03 LAB — OB RESULTS CONSOLE HEPATITIS B SURFACE ANTIGEN: Hepatitis B Surface Ag: NEGATIVE

## 2014-03-03 LAB — OB RESULTS CONSOLE RUBELLA ANTIBODY, IGM: Rubella: IMMUNE

## 2014-03-03 LAB — OB RESULTS CONSOLE RPR: RPR: NONREACTIVE

## 2014-03-03 LAB — RPR: RPR Ser Ql: NONREACTIVE

## 2014-03-03 LAB — POCT FERN TEST: POCT FERN TEST: POSITIVE

## 2014-03-03 LAB — OB RESULTS CONSOLE ABO/RH: RH TYPE: POSITIVE

## 2014-03-03 MED ORDER — SIMETHICONE 80 MG PO CHEW: 80.0000 mg | CHEWABLE_TABLET | ORAL | Status: DC | PRN

## 2014-03-03 MED ORDER — DIBUCAINE 1 % RE OINT: 1.0000 "application " | TOPICAL_OINTMENT | RECTAL | Status: DC | PRN

## 2014-03-03 MED ORDER — OXYTOCIN 40 UNITS IN LACTATED RINGERS INFUSION - SIMPLE MED: 62.5000 mL/h | INTRAVENOUS | Status: DC

## 2014-03-03 MED ORDER — TETANUS-DIPHTH-ACELL PERTUSSIS 5-2.5-18.5 LF-MCG/0.5 IM SUSP: 0.5000 mL | Freq: Once | INTRAMUSCULAR | Status: DC

## 2014-03-03 MED ORDER — LACTATED RINGERS IV SOLN: INTRAVENOUS | Status: DC

## 2014-03-03 MED ORDER — SERTRALINE HCL 25 MG PO TABS: 25.0000 mg | ORAL_TABLET | Freq: Every day | ORAL | Status: DC

## 2014-03-03 MED ORDER — BENZOCAINE-MENTHOL 20-0.5 % EX AERO: 1.0000 "application " | INHALATION_SPRAY | CUTANEOUS | Status: DC | PRN

## 2014-03-03 MED ORDER — OXYTOCIN BOLUS FROM INFUSION: 500.0000 mL | INTRAVENOUS | Status: DC

## 2014-03-03 MED ORDER — IBUPROFEN 600 MG PO TABS
600.0000 mg | ORAL_TABLET | Freq: Four times a day (QID) | ORAL | Status: DC
  Administered 2014-03-04 – 2014-03-05 (×8): 600 mg via ORAL
  Filled 2014-03-03 (×8): qty 1

## 2014-03-03 MED ORDER — LANOLIN HYDROUS EX OINT: TOPICAL_OINTMENT | CUTANEOUS | Status: DC | PRN

## 2014-03-03 MED ORDER — SODIUM CHLORIDE 0.9 % IJ SOLN: 3.0000 mL | INTRAMUSCULAR | Status: DC | PRN

## 2014-03-03 MED ORDER — LACTATED RINGERS IV SOLN: 500.0000 mL | INTRAVENOUS | Status: DC | PRN

## 2014-03-03 MED ORDER — PRENATAL MULTIVITAMIN CH
1.0000 | ORAL_TABLET | Freq: Every day | ORAL | Status: DC
  Administered 2014-03-04 – 2014-03-05 (×2): 1 via ORAL
  Filled 2014-03-03 (×2): qty 1

## 2014-03-03 MED ORDER — ACETAMINOPHEN 325 MG PO TABS
650.0000 mg | ORAL_TABLET | Freq: Four times a day (QID) | ORAL | Status: DC | PRN
  Administered 2014-03-03: 650 mg via ORAL
  Filled 2014-03-03: qty 2

## 2014-03-03 MED ORDER — ONDANSETRON HCL 4 MG/2ML IJ SOLN: 4.0000 mg | Freq: Four times a day (QID) | INTRAMUSCULAR | Status: DC | PRN

## 2014-03-03 MED ORDER — ACETAMINOPHEN 325 MG PO TABS
650.0000 mg | ORAL_TABLET | ORAL | Status: DC | PRN
Start: 2014-03-03 — End: 2014-03-03

## 2014-03-03 MED ORDER — CITRIC ACID-SODIUM CITRATE 334-500 MG/5ML PO SOLN: 30.0000 mL | ORAL | Status: DC | PRN

## 2014-03-03 MED ORDER — ONDANSETRON HCL 4 MG/2ML IJ SOLN: 4.0000 mg | INTRAMUSCULAR | Status: DC | PRN

## 2014-03-03 MED ORDER — LIDOCAINE HCL (PF) 1 % IJ SOLN
30.0000 mL | INTRAMUSCULAR | Status: AC | PRN
  Administered 2014-03-03: 30 mL via SUBCUTANEOUS
  Filled 2014-03-03: qty 30

## 2014-03-03 MED ORDER — OXYTOCIN 10 UNIT/ML IJ SOLN
INTRAMUSCULAR | Status: AC
  Filled 2014-03-03: qty 4

## 2014-03-03 MED ORDER — ZOLPIDEM TARTRATE 5 MG PO TABS: 5.0000 mg | ORAL_TABLET | Freq: Every evening | ORAL | Status: DC | PRN

## 2014-03-03 MED ORDER — SODIUM CHLORIDE 0.9 % IV SOLN: 250.0000 mL | INTRAVENOUS | Status: DC | PRN

## 2014-03-03 MED ORDER — OXYCODONE-ACETAMINOPHEN 5-325 MG PO TABS: 1.0000 | ORAL_TABLET | ORAL | Status: DC | PRN

## 2014-03-03 MED ORDER — ONDANSETRON HCL 4 MG PO TABS: 4.0000 mg | ORAL_TABLET | ORAL | Status: DC | PRN

## 2014-03-03 MED ORDER — WITCH HAZEL-GLYCERIN EX PADS: 1.0000 "application " | MEDICATED_PAD | CUTANEOUS | Status: DC | PRN

## 2014-03-03 MED ORDER — SENNOSIDES-DOCUSATE SODIUM 8.6-50 MG PO TABS
2.0000 | ORAL_TABLET | ORAL | Status: DC
Start: 2014-03-04 — End: 2014-03-05
  Administered 2014-03-03 – 2014-03-04 (×2): 2 via ORAL
  Filled 2014-03-03 (×2): qty 2

## 2014-03-03 MED ORDER — OXYCODONE-ACETAMINOPHEN 5-325 MG PO TABS
1.0000 | ORAL_TABLET | ORAL | Status: DC | PRN
  Administered 2014-03-03 – 2014-03-04 (×2): 2 via ORAL
  Administered 2014-03-04: 1 via ORAL
  Administered 2014-03-04: 2 via ORAL
  Administered 2014-03-04 – 2014-03-05 (×2): 1 via ORAL
  Filled 2014-03-03 (×2): qty 1
  Filled 2014-03-03: qty 2
  Filled 2014-03-03 (×2): qty 1
  Filled 2014-03-03 (×2): qty 2

## 2014-03-03 MED ORDER — IBUPROFEN 600 MG PO TABS
600.0000 mg | ORAL_TABLET | Freq: Four times a day (QID) | ORAL | Status: DC | PRN
  Administered 2014-03-03: 600 mg via ORAL
  Filled 2014-03-03: qty 1

## 2014-03-03 MED ORDER — SODIUM CHLORIDE 0.9 % IJ SOLN: 3.0000 mL | Freq: Two times a day (BID) | INTRAMUSCULAR | Status: DC

## 2014-03-03 NOTE — H&P (Signed)
Terri Cox is a 38 y.o. female, [id number] at [redacted]w[redacted]d, presenting for SROM and early labor.  Denies VB, recent fever, resp or GI c/o's, UTI or PIH s/s. GFM.   Patient Active Problem List   Diagnosis Date Noted  . PROM (premature rupture of membranes) 03/03/2014  . Advanced maternal age (AMA) in pregnancy 03/03/2014  . Factor V Leiden mutation complicating pregnancy 67/89/3810  . Iliotibial band syndrome of left side 07/21/2012  . 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 HEMORRHOIDS, INTERNAL 05/24/2008  . ASTHMA 05/24/2008  . CONSTIPATION, CHRONIC 05/24/2008  . HEADACHE, CHRONIC 05/24/2008  . METATARSALGIA 03/25/2007  . PES PLANUS 03/25/2007  . HALLUX RIGIDUS, ACQUIRED 03/25/2007    History of present pregnancy: Patient entered care at 10 weeks.   EDC of 03/12/14 was established by LMP.   Anatomy scan:  18 weeks, with normal findings and an anterior placenta.   Additional [place] evaluations:  [redacted]w[redacted]d BPP - 8/8, AFI 30th%ile. [redacted]w[redacted]d Growth - 81st%ile, AFI 65th%ile   Significant prenatal events:  Switch to heparin 5000u sq bid at 36wks - from Lovenox Last evaluation:  02/27/14 at [redacted]w[redacted]d   2 cm / 70% / 0  OB History   Grav Para Term Preterm Abortions TAB SAB Ect Mult Living   4 2 2  1  1   2      Past Medical History  Diagnosis Date  . Factor 5 Leiden mutation, heterozygous     on heparin  . Acoustic neuroma     hx of  . Asthma   . Clotting disorder   . Headache(784.0)     with neuroma  . Infection    Past Surgical History  Procedure Laterality Date  . Anterior cruciate ligament repair    . Craniectomy for excision of acoustic neuroma    . Hernia repair  10/2010    umb hernia   Family History: family history includes Asthma in her brother, maternal grandfather, and son; Cancer in her mother, paternal grandfather, and paternal grandmother; Diabetes in her maternal grandmother; Hearing loss in her father; Heart disease in her father and maternal grandmother. Social History:  reports that  she has quit smoking. She has never used smokeless tobacco. She reports that she drinks alcohol. She reports that she does not use illicit drugs.   Prenatal Transfer Tool  Maternal Diabetes: No Genetic Screening: Normal Maternal Ultrasounds/Referrals: Normal Fetal Ultrasounds or other Referrals:  Referred to Materal Fetal Medicine  Maternal Substance Abuse:  No Significant Maternal Medications:  Meds include: Other: Heparin (Lovenox until 36 weeks) Significant Maternal Lab Results: Lab values include: Group B Strep negative    ROS: see HPI above, all other systems are negative   Allergies  Allergen Reactions  . Diphenhydramine Hcl Hives     Dilation: 4 Effacement (%): 90 Station: 0 Exam by:: J Vannah Nadal CNM Blood pressure 128/79, pulse 109, temperature 97.4 F (36.3 C), temperature source Oral, resp. rate 20, height 5\' 4"  (1.626 m), weight 132 lb (59.875 kg), last menstrual period 06/05/2013.  Chest clear Heart RRR without murmur Abd gravid, NT Ext: WNL  FHR: Reactive NST UCs:  Occational  Prenatal labs: ABO, Rh: --/--/A POS (03/07 1520) Antibody: POS (03/07 1520) Rubella:   Immune RPR: Nonreactive (03/07 0000)  HBsAg: Negative (03/07 0000)  HIV: Non-reactive (03/07 0000)  GBS: Negative (02/14 0000) Sickle cell/Hgb electrophoresis:  n/a Pap:  08/18/13 WNL GC:  Neg Chlamydia:  Neg Genetic screenings:  AFP and Harmony neg Glucola:  127 Other:  Antibody screen - Anti M    Assessment/Plan: IUP at [redacted]w[redacted]d Early labor and SROM GBS neg Desires waterbirth Factor V - last dose of Heparin this am AMA  Admit to BS per c/w Dr. Mancel Bale Routine L&D orders with IA FHT monitoring and Saline lock Continue to monitor for labor, will discuss Pitocin PRN   Emilee Hero, MSN 03/03/2014, 5:23 PM

## 2014-03-03 NOTE — MAU Note (Signed)
"  can you check this and see if my water broke, that's the only reason I am here". (pt had pulled out a ziplock bag with a pad in it)

## 2014-03-03 NOTE — MAU Note (Signed)
Leaking at 0600, clear fluid. No bleeding. Sporadic ctx's

## 2014-03-03 NOTE — Progress Notes (Signed)
  Subjective: Pt reports UCs more frequent and intense. Visibly having to breath through UCs.  Objective: BP 149/91  Pulse 107  Temp(Src) 97.9 F (36.6 C) (Oral)  Resp 18  Ht 5\' 4"  (1.626 m)  Wt 132 lb (59.875 kg)  BMI 22.65 kg/m2  SpO2 97%  LMP 06/05/2013  Breastfeeding? Unknown I/O last 3 completed shifts: In: -  Out: 350 [Blood:350]    FHT:  IA reassuring UC:   regular, about every 5 min  SVE:   Dilation: 4 Effacement (%): 90 Station: Crowning Exam by:: J Meiya Wisler CNM  Assessment / Plan:  Spontaneous labor, progressing normally  Labor: Progressing normally  Preeclampsia: no signs or symptoms of toxicity  Fetal Wellbeing: IA reassuring  Pain Control: Labor support without medications  I/D: GBS neg; SROM at 0700; Afebrile  Anticipated MOD: NSVD   Terri Cox, [place] 03/03/2014, 9:02 PM

## 2014-03-03 NOTE — MAU Note (Signed)
On heparin for Factor V. Plans to donate placenta and cord blood. Plans for water birth

## 2014-03-04 ENCOUNTER — Encounter (HOSPITAL_COMMUNITY): Payer: Self-pay | Admitting: *Deleted

## 2014-03-04 LAB — CBC
HCT: 34.1 % — ABNORMAL LOW (ref 36.0–46.0)
HEMATOCRIT: 34.7 % — AB (ref 36.0–46.0)
HEMOGLOBIN: 11.8 g/dL — AB (ref 12.0–15.0)
HEMOGLOBIN: 11.8 g/dL — AB (ref 12.0–15.0)
MCH: 29.6 pg (ref 26.0–34.0)
MCH: 30.1 pg (ref 26.0–34.0)
MCHC: 34 g/dL (ref 30.0–36.0)
MCHC: 34.6 g/dL (ref 30.0–36.0)
MCV: 87 fL (ref 78.0–100.0)
MCV: 87 fL (ref 78.0–100.0)
PLATELETS: 191 10*3/uL (ref 150–400)
Platelets: 192 10*3/uL (ref 150–400)
RBC: 3.92 MIL/uL (ref 3.87–5.11)
RBC: 3.99 MIL/uL (ref 3.87–5.11)
RDW: 14.3 % (ref 11.5–15.5)
RDW: 14.4 % (ref 11.5–15.5)
WBC: 17.3 10*3/uL — AB (ref 4.0–10.5)
WBC: 22.1 10*3/uL — AB (ref 4.0–10.5)

## 2014-03-04 LAB — LACTATE DEHYDROGENASE: LDH: 223 U/L (ref 94–250)

## 2014-03-04 LAB — COMPREHENSIVE METABOLIC PANEL
ALK PHOS: 100 U/L (ref 39–117)
ALT: 12 U/L (ref 0–35)
AST: 23 U/L (ref 0–37)
Albumin: 2.3 g/dL — ABNORMAL LOW (ref 3.5–5.2)
BUN: 6 mg/dL (ref 6–23)
CO2: 22 meq/L (ref 19–32)
CREATININE: 0.5 mg/dL (ref 0.50–1.10)
Calcium: 9.4 mg/dL (ref 8.4–10.5)
Chloride: 99 mEq/L (ref 96–112)
GFR calc Af Amer: >90 mL/min (ref >90)
GLUCOSE: 111 mg/dL — AB (ref 70–99)
POTASSIUM: 4.3 meq/L (ref 3.7–5.3)
Sodium: 134 mEq/L — ABNORMAL LOW (ref 137–147)
Total Protein: 5.8 g/dL — ABNORMAL LOW (ref 6.0–8.3)

## 2014-03-04 LAB — PROTEIN / CREATININE RATIO, URINE
Creatinine, Urine: 31.69 mg/dL
Protein Creatinine Ratio: 0.13 (ref 0.00–0.15)
Total Protein, Urine: 4.1 mg/dL

## 2014-03-04 LAB — URIC ACID: Uric Acid, Serum: 4.9 mg/dL (ref 2.4–7.0)

## 2014-03-04 MED ORDER — LACTATED RINGERS IV BOLUS (SEPSIS)
500.0000 mL | Freq: Once | INTRAVENOUS | Status: AC
  Administered 2014-03-04: 500 mL via INTRAVENOUS

## 2014-03-04 MED ORDER — LACTATED RINGERS IV SOLN
INTRAVENOUS | Status: DC
  Administered 2014-03-04: 05:00:00 via INTRAVENOUS

## 2014-03-04 MED ORDER — ENOXAPARIN SODIUM 40 MG/0.4ML ~~LOC~~ SOLN
40.0000 mg | SUBCUTANEOUS | Status: DC
  Administered 2014-03-04 – 2014-03-05 (×2): 40 mg via SUBCUTANEOUS
  Filled 2014-03-04 (×3): qty 0.4

## 2014-03-04 MED ORDER — SERTRALINE HCL 25 MG PO TABS
25.0000 mg | ORAL_TABLET | Freq: Every day | ORAL | Status: DC
  Administered 2014-03-04 – 2014-03-05 (×2): 25 mg via ORAL
  Filled 2014-03-04 (×3): qty 1

## 2014-03-04 NOTE — Progress Notes (Signed)
Bleeding tapered off during the night and is now small in amount.No further clots at this time.Patient tolerated walking to bathroom well.Vital signs stable.

## 2014-03-04 NOTE — Plan of Care (Signed)
Problem: Phase I Progression Outcomes Goal: Pain controlled with appropriate interventions Outcome: Completed/Met Date Met:  03/04/14 Good pain control on po Percocet Goal: OOB as tolerated unless otherwise ordered Outcome: Completed/Met Date Met:  03/04/14 Got faint down in NICU made to understand to call when she needs to get up,husband aware as well. Goal: Initial discharge plan identified Outcome: Completed/Met Date Met:  03/04/14 VSS  Bleeding WNL Pain controlled Understands self care Understands f/u

## 2014-03-04 NOTE — Progress Notes (Signed)
Post Partum Day 1:S/P SVB, 1st degree perineal Subjective: Patient up ad lib, denies syncope or dizziness.  Had syncopal episode last night while in NICU, but no recurrence. Feeding:  Breast Contraceptive plan:  Undecided  Baby to NICU due to hypoglycemia and tachypnea, but doing better today.  Parents hope he will be d/c'd from NICU tomorrow.  Patient has requested to start Zoloft due to hx depression.  Objective: Blood pressure 127/73, pulse 103, temperature 98.1 F (36.7 C), temperature source Oral, resp. rate 20, height 5\' 4"  (1.626 m), weight 132 lb (59.875 kg), last menstrual period 06/05/2013, SpO2 100.00%, unknown if currently breastfeeding.  Physical Exam:  General: alert Lochia: appropriate Uterine Fundus: firm Incision: healing well DVT Evaluation: No evidence of DVT seen on physical exam. Negative Homan's sign.   Recent Labs  03/03/14 2350 03/04/14 0520  HGB 11.8* 11.8*  HCT 34.7* 34.1*    Assessment/Plan: S/P Vaginal delivery day 1 NICU infant--stable Continue current care Rx Zoloft 25 mg po daily. Plan for discharge tomorrow   LOS: 1 day   Donnel Saxon 03/04/2014,

## 2014-03-04 NOTE — Progress Notes (Signed)
Patient arrived to unit from [facility] via wheelchair and refused to be assessed.Patient states she is fine,she is going to see her baby.

## 2014-03-04 NOTE — Progress Notes (Signed)
Patient on her way back down to [place] in wheelchair and was headed to the elevator stated she passed a clot.Water was bloody and I was unable to see the clot.I put my hand in the toilet and a clot the size of a large sweet potato was there.I got a call 10 minutes later saying the patient had passed out in the NICU.Patient arrived back on floor and was pale and clammy.Orthostatic bp's done.Lying 133/79,sitting 127/81.standing,127/76.Fundus U/E firm,bleeding [place] notified and orders received.

## 2014-03-04 NOTE — Progress Notes (Signed)
Clinical Social Work Department PSYCHOSOCIAL ASSESSMENT - MATERNAL/CHILD 03/04/2014  Patient:  SACHA, TOPOR  Account Number:  [id number]  Admit Date:  03/03/2014  Ardine Eng Name:   Geni Bers    Clinical Social Worker:  Sherian Valenza, LCSW   Date/Time:  03/04/2014 10:30 AM  Date Referred:  03/04/2014   Referral source  NICU     Referred reason  NICU   Other referral source:    I:  FAMILY / HOME ENVIRONMENT Child's legal guardian:  PARENT  Guardian - Name Guardian - Age Guardian - Address  BREIGH, ANNETT [address] [place], [place] [address]  [place], Herbie [place]  same as above   Other household support members/support persons Other support:    II  PSYCHOSOCIAL DATA Information Source:    Occupational hygienist Employment:   Museum/gallery curator resources:  Multimedia programmer [place]:    School / Grade:   Maternity Care Coordinator / Child Services Coordination / Early Interventions:  Cultural issues impacting care:    III  STRENGTHS Strengths  Understanding of illness  Home prepared for Child (including basic supplies)  Adequate Resources  Supportive family/friends   Strength comment:    IV  RISK FACTORS AND CURRENT PROBLEMS Current Problem:       V  SOCIAL WORK ASSESSMENT Met with parents.  They were pleasant and receptive to social work intervention.   Interaction was mainly between this Probation officer and the father.  Parents are married.  They have two other dependents ages 73 and 2.   Both parents are employed and mother reports plan to return to work. Newborn was transferred to NICU after being in the nursery. Informed that they have spoken with the medical team and was told that newborn was weaned off O2 and may be able to return to the nursery.  Parents are hopeful of this. Mother reports no hx of substance abuse or mental illness. No acute social concerns related at this time. Parents informed of social work Fish farm manager.       VI SOCIAL WORK PLAN Social Work Plan  Psychosocial Support/Ongoing Assessment of Needs

## 2014-03-05 MED ORDER — OXYCODONE-ACETAMINOPHEN 5-325 MG PO TABS: 1.0000 | ORAL_TABLET | ORAL | Status: DC | PRN

## 2014-03-05 MED ORDER — IBUPROFEN 600 MG PO TABS: 600.0000 mg | ORAL_TABLET | Freq: Four times a day (QID) | ORAL | Status: DC

## 2014-03-05 MED ORDER — SERTRALINE HCL 25 MG PO TABS: 25.0000 mg | ORAL_TABLET | Freq: Every day | ORAL | Status: AC

## 2014-03-05 MED ORDER — ENOXAPARIN SODIUM 40 MG/0.4ML ~~LOC~~ SOLN: 40.0000 mg | SUBCUTANEOUS | Status: DC

## 2014-03-05 NOTE — Lactation Note (Signed)
This note was copied from the chart of Girl Daviona Herbert. Lactation Consultation Note       Follow up consult with this mom  to explain the benefits of   pumping after abreast feeding her term baby. I explained this will increase her supply(protect her milk), and protect EBM for her baby, to increase baby's hydration, and in case the baby is cuing for mom, and mom is not able to be in the NICU at this time. Mom teary eyed, but understood. She plans to stay nearby in the [employer], so she can be there to breast feed. Mom was encouraged to try and rest inbetween feeding and pumping, because we are concerned about her well being. I will follow this family in the NICU,  Patient Name: Girl Irlanda Croghan [id number] Date: 03/05/2014 Reason for consult: Follow-up assessment;NICU baby   Maternal Data    Feeding    LATCH Score/Interventions                      Lactation Tools Discussed/Used Pump Review: Other (comment) (reason for pumping with term baby, despite breast feeding, explained )   Consult Status Consult Status: Follow-up Date: 03/06/14 Follow-up type: In-patient (in N ICU)    Tonna Corner 03/05/2014, 2:56 PM

## 2014-03-05 NOTE — Lactation Note (Signed)
This note was copied from the chart of Girl Naelle Diegel. Lactation Consultation Note  Patient Name: Girl Adama Ivins RCBUL'A Date: 03/05/2014 Reason for consult: Initial assessment;NICU baby   Maternal Data Formula Feeding for Exclusion: Yes (baby in NICU) Infant to breast within first hour of birth: Yes Breastfeeding delayed due to:: Infant status Has patient been taught Hand Expression?: Yes Does the patient have breastfeeding experience prior to this delivery?: Yes  Feeding Feeding Type: Breast Fed  LATCH Score/Interventions Latch: Grasps breast easily, tongue down, lips flanged, rhythmical sucking.  Audible Swallowing: Spontaneous and intermittent  Type of Nipple: Everted at rest and after stimulation  Comfort (Breast/Nipple): Soft / non-tender     Hold (Positioning): No assistance needed to correctly position infant at breast. Intervention(s): Breastfeeding basics reviewed;Support Pillows;Position options;Skin to skin (mom using cradle - suggested she try cross cradel to obtain a deeper latch, although baby uis latched well)  LATCH Score: 10  Lactation Tools Discussed/Used Tools: Pump WIC Program: No Pump Review: Setup, frequency, and cleaning (mom encouraged to begin pumping - now 40 hours pp) Initiated by:: bedside rn   Consult Status Consult Status: Follow-up Date: 03/06/14 Follow-up type: In-patient    Tonna Corner 03/05/2014, 11:26 AM

## 2014-03-05 NOTE — Lactation Note (Signed)
This note was copied from the chart of Terri Cox. Lactation Consultation Note     Initial consult with this mom and baby, in NICU. Baby is now 40 hours postpartum, and is full term. She is on IVF for low blood sugars, which is being weaned. Mom has been exclusively breast feeding, and does not want the baby to get formula or bottle. Mom may be discharged to home today. I suggested the [employer], so she can still be close by for breast feeding.  Mom has not been pumping,since she is breast feeding the baby.  I suggested se begin pumping, since she separated from her baby, to protect her supply and provide eBM for her. On exam, mom already seems to be transferring into mature milk. I will follow this family in the NICU.  Patient Name: Terri Jamise Pentland [id number] Date: 03/05/2014 Reason for consult: Initial assessment;NICU baby   Maternal Data Formula Feeding for Exclusion: Yes (baby in NICU) Infant to breast within first hour of birth: Yes Has patient been taught Hand Expression?: Yes Does the patient have breastfeeding experience prior to this delivery?: Yes  Feeding Feeding Type: Breast Fed  LATCH Score/Interventions Latch: Grasps breast easily, tongue down, lips flanged, rhythmical sucking.  Audible Swallowing: Spontaneous and intermittent  Type of Nipple: Everted at rest and after stimulation  Comfort (Breast/Nipple): Soft / non-tender     Hold (Positioning): No assistance needed to correctly position infant at breast. Intervention(s): Breastfeeding basics reviewed;Support Pillows;Position options;Skin to skin (mom using cradle - suggested she try cross cradel to obtain a deeper latch, although baby uis latched well)  LATCH Score: 10  Lactation Tools Discussed/Used Tools: Pump WIC Program: No Pump Review: Setup, frequency, and cleaning (mom encouraged to begin pumping - now 40 hours pp) Initiated by:: bedside rn   Consult Status Consult Status:  Follow-up Date: 03/06/14 Follow-up type: In-patient    Tonna Corner 03/05/2014, 11:03 AM

## 2014-03-05 NOTE — Discharge Instructions (Signed)

## 2014-03-05 NOTE — Plan of Care (Signed)
Problem: Discharge Progression Outcomes Goal: Barriers To Progression Addressed/Resolved Outcome: Completed/Met Date Met:  03/05/14 Vaginal bleeding now down to small to scant in amount. Goal: Activity appropriate for discharge plan Outcome: Completed/Met Date Met:  03/05/14 Walks frequently to NICU without getting dizzy. Goal: Tolerating diet Outcome: Completed/Met Date Met:  03/05/14 Tolerates a Regular diet well. Goal: Complications resolved/controlled Outcome: Completed/Met Date Met:  03/05/14 Vaginal bleeding now WNL.No further dizziness. Goal: Pain controlled with appropriate interventions Outcome: Completed/Met Date Met:  03/05/14 Good pain control on po Motrin and Percocet. Goal: Discharge plan in place and appropriate Outcome: Completed/Met Date Met:  03/05/14 VSS Pain controlled Vaginal bleeding WNL Understands home and f/u care     

## 2014-03-05 NOTE — Progress Notes (Signed)
Chaplain offered emotional and spiritual support to patient via referral from RN. Patient was lying down with lights off, and said she did not want to talk right now. Will follow up as available.   Ethelene Browns, Chaplain

## 2014-03-05 NOTE — Discharge Summary (Signed)
Obstetric Discharge Summary Reason for Admission: onset of labor Prenatal Procedures: NST and ultrasound Intrapartum Procedures: spontaneous vaginal delivery Postpartum Procedures: none and lovenox Complications-Operative and Postpartum: none Hemoglobin  Date Value Ref Range Status  03/04/2014 11.8* 12.0 - 15.0 g/dL Final     HCT  Date Value Ref Range Status  03/04/2014 34.1* 36.0 - 46.0 % Final    Physical Exam:  General: alert and cooperative Lochia: appropriate Uterine Fundus: firm Incision: na DVT Evaluation: No evidence of DVT seen on physical exam.  Discharge Diagnoses: Term Pregnancy-delivered and Pt on lovenox for factro V ledien deficiancy. She should continue for 6 weeks  Discharge Information: Date: 03/05/2014 Activity: pelvic rest Diet: routine Medications: PNV, Ibuprofen, Percocet and lovenox Condition: stable Instructions: refer to practice specific booklet Discharge to: home Follow-up Information   Follow up with [facility] In 6 weeks.   Specialty:  Obstetrics and Gynecology   Contact information:   [address] [place] [address] [phone]      Newborn Data: Live born female  Birth Weight: 8 lb 7.8 oz (3850 g) APGAR: 7, 8  Home with in NICU .  [place] A 03/05/2014, 2:12 PM

## 2014-03-07 LAB — TYPE AND SCREEN
ABO/RH(D): A POS
Antibody Screen: POSITIVE
DAT, IGG: NEGATIVE
PT AG TYPE: NEGATIVE
Unit division: 0
Unit division: 0

## 2014-03-21 ENCOUNTER — Encounter: Payer: Self-pay | Admitting: *Deleted

## 2014-04-04 ENCOUNTER — Encounter: Payer: Self-pay | Admitting: Emergency Medicine

## 2014-04-04 ENCOUNTER — Ambulatory Visit (INDEPENDENT_AMBULATORY_CARE_PROVIDER_SITE_OTHER): Payer: BC Managed Care – PPO | Admitting: Emergency Medicine

## 2014-04-04 VITALS — BP 113/80 | Ht 64.0 in | Wt 132.0 lb

## 2014-04-04 DIAGNOSIS — M6289 Other specified disorders of muscle: Secondary | ICD-10-CM

## 2014-04-04 DIAGNOSIS — M6580 Other synovitis and tenosynovitis, unspecified site: Secondary | ICD-10-CM

## 2014-04-04 DIAGNOSIS — S8410XA Injury of peroneal nerve at lower leg level, unspecified leg, initial encounter: Secondary | ICD-10-CM

## 2014-04-04 NOTE — Assessment & Plan Note (Signed)
She's been doing some home exercises however the symptoms have continued/worsening. Lower for her to Terri Cox for formal physical therapy. She'll followup as needed.

## 2014-04-04 NOTE — Progress Notes (Signed)
Patient ID: NEVILLE PAULS, female   DOB: 1976-03-31, 38 y.o.   MRN: [id number] 38 year old female approximately 4 weeks from delivery child. Complains of left lateral hip pain and numbness and tingling in the dorsum of her right foot. She reports lateral hip pain is similar to pain she had after her first child was born. He is worse with hip abduction. Causes her to limp somewhat when walking. No radiation of pain down leg. Other than delivery no inciting injury or event. Pain worse with stretching or doing yoga. With respect to her foot she is a numbness and shooting sensation of pain across the dorsum of her foot towards her great toe. This is exacerbated by shoes when touching it as well as direct pressure. Symptoms are also reproduced with pressure across the anterior portion of her ankle. No inciting event or injury. Denies any significant amount of swelling when pregnant.  Pertinent past medical history 4 weeks' status post childbirth next  Social history: Nonsmoker  Review of systems as per history of present illness otherwise all systems negative  Examination: BP 113/80  Ht 5\' 4"  (1.626 m)  Wt 132 lb (59.875 kg)  BMI 22.65 kg/m2 Well-developed well-nourished 38 year old female awake alert oriented no acute distress  Left hip: Tenderness to palpation of the tensor fascia lata muscle. Mildly weak abduction with pain on resisted abduction is performed. Negative logroll No gluteal tenderness or evidence of sciatica Negative straight leg raise Negative Faber/Fadir  Right foot: No swelling or erythema Positive Tinel sign over the peroneal nerve in the anterior shin.

## 2014-04-04 NOTE — Assessment & Plan Note (Signed)
We will take a conservative approach to treatment of this. Her symptoms persist for more than 6 weeks we will consider nerve conduction studies and further evaluation

## 2014-10-29 ENCOUNTER — Encounter: Payer: Self-pay | Admitting: Emergency Medicine

## 2015-01-09 ENCOUNTER — Ambulatory Visit (INDEPENDENT_AMBULATORY_CARE_PROVIDER_SITE_OTHER): Payer: BC Managed Care – PPO | Admitting: Sports Medicine

## 2015-01-09 ENCOUNTER — Encounter: Payer: Self-pay | Admitting: Sports Medicine

## 2015-01-09 VITALS — BP 111/77 | Ht 64.0 in | Wt 133.0 lb

## 2015-01-09 DIAGNOSIS — M7632 Iliotibial band syndrome, left leg: Secondary | ICD-10-CM

## 2015-01-09 DIAGNOSIS — M2020 Hallux rigidus, unspecified foot: Secondary | ICD-10-CM

## 2015-01-09 NOTE — Progress Notes (Signed)
  GERLENE GLASSBURN - 39 y.o. female MRN [id number]  Date of birth: [birth year]  [place] is here to follow up: CC: Left Hip Pain and Hallux Rigidus Patient reports overall doing well. Has been seeing a chiropractor and regular basis. No specific therapeutic exercises at home. She has been wearing custom orthotics for greater than 8 years and is tolerating these well and feels as though this contributes to her overall well-being. She has been overall active and not having any significant pain or debility but does have occasional left-sided lateral thigh pain consistent with IT band syndrome. She's here today for custom orthotic fabrication.  ROS:  Per HPI.   HISTORY: Past Medical, Surgical, Social, and Family History Reviewed & Updated per EMR.  Pertinent Historical Findings include: History of anterior cruciate ligament repair, excision of acoustic neuroma Known rectus diastasis  OBJECTIVE:  VS:   HT:5\' 4"  (162.6 cm)   WT:133 lb (60.328 kg)  BMI:22.9          BP:111/77 mmHg  HR: bpm  TEMP: ( )  RESP:   PHYSICAL EXAM: GENERAL:  adult Caucasian female. In no discomfort; no respiratory distress   PSYCH: alert and appropriate, good insight   NEURO: sensation is intact to light touch in bilateral lower extremities   VASCULAR:  DP pulses 2/4.  No significant edema.   Bilateral Foot & Ankle Exam: Appearance: Forefoot alignment: neutral Hindfoot alignment: neutral Longitudinal Arch: Moderate Transverse Arch: Early collapse with splay toe deformity between first and second bilaterally   Skin: No overlying erythema/ecchymosis.  Palpation: No TTP over: Bilateral Achilles posterior tibialis Metatarsal Squeeze Test: Negative   Strength, ROM & OtherTests: Ankle Dorsiflexion: Right = 95; Left = 90 Great toe motion: Right and left = 30 of extension 15 flexion Left hip abduction 5-/5 with TFL predominant     OSTEOPATHIC Exam: LEG LENGTH: Pseudo-short left leg  SOMATIC DYSFUNCTION    Sacrum: Neutral               Pelvis: Left superior shear   ASSESSMENT: 1. Hallux rigidus, unspecified laterality   2. Iliotibial band syndrome of left side    PROCEDURE NOTE: CUSTOM ORTHOTICS The patient was fitted for a standard, cushioned, semi-rigid orthotic. The orthotic was heated & placed on the orthotic stand. The patient was positioned in subtalar neutral position and 10 of ankle dorsiflexion and weight bearing stance some heated orthotic blank. After completion of the molding a stable paste was applied to the orthotic blank. The orthotic was ground to a stable position for weightbearing. The patient ambulated in these and reported they were comfortable without pressure spots.              BLANK:  Size 8 - Standard Cushioned                 BASE:  Blue EVA      POSTINGS:  Bilateral first ray post and bilateral small metatarsal pad >50% of this 45 minute visit was spent in direct face to face evaluation, measurement and manufacture of custom molded orthotic.   PLAN: See problem based charting & AVS for additional documentation.  Custom orthotics today  HEP: Hip abduction strength, Achilles eccentric stretching > Return if symptoms worsen or fail to improve.

## 2015-01-21 ENCOUNTER — Other Ambulatory Visit: Payer: Self-pay

## 2015-01-21 DIAGNOSIS — Z1231 Encounter for screening mammogram for malignant neoplasm of breast: Secondary | ICD-10-CM

## 2015-01-29 ENCOUNTER — Ambulatory Visit
Admission: RE | Admit: 2015-01-29 | Discharge: 2015-01-29 | Disposition: A | Discharge status: Home / Self Care | Payer: BC Managed Care – PPO | Source: Ambulatory Visit

## 2015-01-29 DIAGNOSIS — Z1231 Encounter for screening mammogram for malignant neoplasm of breast: Secondary | ICD-10-CM

## 2015-01-30 ENCOUNTER — Other Ambulatory Visit: Payer: Self-pay | Admitting: Family Medicine

## 2015-01-30 DIAGNOSIS — R928 Other abnormal and inconclusive findings on diagnostic imaging of breast: Secondary | ICD-10-CM

## 2015-02-12 ENCOUNTER — Ambulatory Visit
Admission: RE | Admit: 2015-02-12 | Discharge: 2015-02-12 | Disposition: A | Discharge status: Home / Self Care | Payer: BC Managed Care – PPO | Source: Ambulatory Visit | Attending: Family Medicine | Admitting: Family Medicine

## 2015-02-12 DIAGNOSIS — R928 Other abnormal and inconclusive findings on diagnostic imaging of breast: Secondary | ICD-10-CM

## 2015-10-03 ENCOUNTER — Other Ambulatory Visit: Payer: Self-pay | Admitting: Family Medicine

## 2015-10-03 DIAGNOSIS — R921 Mammographic calcification found on diagnostic imaging of breast: Secondary | ICD-10-CM

## 2015-10-15 ENCOUNTER — Other Ambulatory Visit: Payer: Self-pay | Admitting: Family Medicine

## 2015-10-15 ENCOUNTER — Ambulatory Visit
Admission: RE | Admit: 2015-10-15 | Discharge: 2015-10-15 | Disposition: A | Discharge status: Home / Self Care | Payer: BC Managed Care – PPO | Source: Ambulatory Visit | Attending: Family Medicine | Admitting: Family Medicine

## 2015-10-15 DIAGNOSIS — R921 Mammographic calcification found on diagnostic imaging of breast: Secondary | ICD-10-CM

## 2015-10-17 ENCOUNTER — Inpatient Hospital Stay: Admission: RE | Admit: 2015-10-17 | Payer: BC Managed Care – PPO | Source: Ambulatory Visit

## 2015-10-30 ENCOUNTER — Other Ambulatory Visit: Payer: Self-pay | Admitting: Family Medicine

## 2015-10-30 DIAGNOSIS — R921 Mammographic calcification found on diagnostic imaging of breast: Secondary | ICD-10-CM

## 2015-10-31 ENCOUNTER — Ambulatory Visit
Admission: RE | Admit: 2015-10-31 | Discharge: 2015-10-31 | Disposition: A | Discharge status: Home / Self Care | Payer: BC Managed Care – PPO | Source: Ambulatory Visit | Attending: Family Medicine | Admitting: Family Medicine

## 2015-10-31 DIAGNOSIS — R921 Mammographic calcification found on diagnostic imaging of breast: Secondary | ICD-10-CM

## 2016-05-12 ENCOUNTER — Other Ambulatory Visit: Payer: Self-pay | Admitting: Family Medicine

## 2016-05-12 ENCOUNTER — Other Ambulatory Visit (HOSPITAL_COMMUNITY)
Admission: RE | Admit: 2016-05-12 | Discharge: 2016-05-12 | Disposition: A | Discharge status: Home / Self Care | Payer: BC Managed Care – PPO | Source: Ambulatory Visit | Attending: Family Medicine | Admitting: Family Medicine

## 2016-05-12 DIAGNOSIS — Z1151 Encounter for screening for human papillomavirus (HPV): Secondary | ICD-10-CM | POA: Diagnosis present

## 2016-05-12 DIAGNOSIS — Z124 Encounter for screening for malignant neoplasm of cervix: Secondary | ICD-10-CM | POA: Diagnosis present

## 2016-05-13 LAB — CYTOLOGY - PAP

## 2016-06-22 ENCOUNTER — Other Ambulatory Visit: Payer: Self-pay | Admitting: Family Medicine

## 2016-06-22 DIAGNOSIS — Z1231 Encounter for screening mammogram for malignant neoplasm of breast: Secondary | ICD-10-CM

## 2016-06-23 ENCOUNTER — Ambulatory Visit
Admission: RE | Admit: 2016-06-23 | Discharge: 2016-06-23 | Disposition: A | Discharge status: Home / Self Care | Payer: BC Managed Care – PPO | Source: Ambulatory Visit | Attending: Family Medicine | Admitting: Family Medicine

## 2016-06-23 DIAGNOSIS — Z1231 Encounter for screening mammogram for malignant neoplasm of breast: Secondary | ICD-10-CM

## 2017-07-30 ENCOUNTER — Other Ambulatory Visit: Payer: Self-pay | Admitting: Family Medicine

## 2017-07-30 DIAGNOSIS — Z1231 Encounter for screening mammogram for malignant neoplasm of breast: Secondary | ICD-10-CM

## 2017-08-09 ENCOUNTER — Ambulatory Visit
Admission: RE | Admit: 2017-08-09 | Discharge: 2017-08-09 | Disposition: A | Discharge status: Home / Self Care | Payer: BC Managed Care – PPO | Source: Ambulatory Visit | Attending: Family Medicine | Admitting: Family Medicine

## 2017-08-09 DIAGNOSIS — Z1231 Encounter for screening mammogram for malignant neoplasm of breast: Secondary | ICD-10-CM

## 2018-07-12 ENCOUNTER — Other Ambulatory Visit: Payer: Self-pay | Admitting: Family Medicine

## 2018-07-12 DIAGNOSIS — Z1231 Encounter for screening mammogram for malignant neoplasm of breast: Secondary | ICD-10-CM

## 2018-08-11 ENCOUNTER — Ambulatory Visit: Payer: BC Managed Care – PPO

## 2018-09-06 ENCOUNTER — Ambulatory Visit
Admission: RE | Admit: 2018-09-06 | Discharge: 2018-09-06 | Disposition: A | Discharge status: Home / Self Care | Payer: BC Managed Care – PPO | Source: Ambulatory Visit | Attending: Family Medicine | Admitting: Family Medicine

## 2018-09-06 DIAGNOSIS — Z1231 Encounter for screening mammogram for malignant neoplasm of breast: Secondary | ICD-10-CM

## 2019-09-25 ENCOUNTER — Other Ambulatory Visit: Payer: Self-pay | Admitting: Family Medicine

## 2019-09-25 DIAGNOSIS — Z1231 Encounter for screening mammogram for malignant neoplasm of breast: Secondary | ICD-10-CM

## 2019-09-26 ENCOUNTER — Ambulatory Visit
Admission: RE | Admit: 2019-09-26 | Discharge: 2019-09-26 | Disposition: A | Discharge status: Home / Self Care | Payer: BC Managed Care – PPO | Source: Ambulatory Visit | Attending: Family Medicine | Admitting: Family Medicine

## 2019-09-26 ENCOUNTER — Other Ambulatory Visit: Payer: Self-pay

## 2019-09-26 DIAGNOSIS — Z1231 Encounter for screening mammogram for malignant neoplasm of breast: Secondary | ICD-10-CM

## 2020-08-16 ENCOUNTER — Other Ambulatory Visit: Payer: Self-pay | Admitting: Family Medicine

## 2020-08-16 DIAGNOSIS — Z1231 Encounter for screening mammogram for malignant neoplasm of breast: Secondary | ICD-10-CM

## 2020-09-26 ENCOUNTER — Other Ambulatory Visit: Payer: Self-pay

## 2020-09-26 ENCOUNTER — Ambulatory Visit
Admission: RE | Admit: 2020-09-26 | Discharge: 2020-09-26 | Disposition: A | Discharge status: Home / Self Care | Payer: BC Managed Care – PPO | Source: Ambulatory Visit | Attending: Family Medicine | Admitting: Family Medicine

## 2020-09-26 DIAGNOSIS — Z1231 Encounter for screening mammogram for malignant neoplasm of breast: Secondary | ICD-10-CM

## 2021-08-26 ENCOUNTER — Other Ambulatory Visit: Payer: Self-pay | Admitting: Family Medicine

## 2021-08-26 DIAGNOSIS — Z1231 Encounter for screening mammogram for malignant neoplasm of breast: Secondary | ICD-10-CM

## 2021-10-02 ENCOUNTER — Other Ambulatory Visit: Payer: Self-pay

## 2021-10-02 ENCOUNTER — Ambulatory Visit
Admission: RE | Admit: 2021-10-02 | Discharge: 2021-10-02 | Disposition: A | Discharge status: Home / Self Care | Payer: BC Managed Care – PPO | Source: Ambulatory Visit | Attending: Family Medicine | Admitting: Family Medicine

## 2021-10-02 DIAGNOSIS — Z1231 Encounter for screening mammogram for malignant neoplasm of breast: Secondary | ICD-10-CM

## 2022-10-08 ENCOUNTER — Other Ambulatory Visit: Payer: Self-pay | Admitting: Family Medicine

## 2022-10-08 DIAGNOSIS — Z1231 Encounter for screening mammogram for malignant neoplasm of breast: Secondary | ICD-10-CM

## 2022-11-13 ENCOUNTER — Ambulatory Visit
Admission: RE | Admit: 2022-11-13 | Discharge: 2022-11-13 | Disposition: A | Discharge status: Home / Self Care | Payer: BC Managed Care – PPO | Source: Ambulatory Visit | Attending: Family Medicine | Admitting: Family Medicine

## 2022-11-13 DIAGNOSIS — Z1231 Encounter for screening mammogram for malignant neoplasm of breast: Secondary | ICD-10-CM

## 2023-09-29 ENCOUNTER — Encounter: Payer: Self-pay | Admitting: Sports Medicine

## 2023-09-29 ENCOUNTER — Ambulatory Visit: Payer: BC Managed Care – PPO | Admitting: Sports Medicine

## 2023-09-29 ENCOUNTER — Other Ambulatory Visit: Payer: Self-pay

## 2023-09-29 VITALS — BP 122/80 | Ht 63.5 in | Wt 143.0 lb

## 2023-09-29 DIAGNOSIS — M25551 Pain in right hip: Secondary | ICD-10-CM

## 2023-09-29 MED ORDER — MELOXICAM 15 MG PO TABS: 15.0000 mg | ORAL_TABLET | Freq: Every day | ORAL | 0 refills | Status: DC

## 2023-09-29 NOTE — Progress Notes (Signed)
PCP: Sigmund Hazel, MD  Subjective:   HPI: Patient is a 47 y.o. female here for right hip pain.  Patient states that she has been dealing with right hip pain for the past few months now.  Patient states that the pain feels located in the inguinal region but also feels located in the lateral portion near the greater trochanter.  Patient states that the pain has been debilitating and keeps her from doing things such as walking long distances.  Patient is very active and does do Pilates 2 times a week and feels her strength is good but the pain is the limiting.  Patient denies any trauma or any specific injury that she can remember.  Patient denies any falls but does note she has a history of some tensor fascia lata pain in the past long time ago.  Past Medical History:  Diagnosis Date   Acoustic neuroma (HCC)    hx of   Asthma    Clotting disorder (HCC)    Factor 5 Leiden mutation, heterozygous (HCC)    on heparin   Headache(784.0)    with neuroma   Infection     Current Outpatient Medications on File Prior to Visit  Medication Sig Dispense Refill   albuterol (PROVENTIL HFA;VENTOLIN HFA) 108 (90 BASE) MCG/ACT inhaler Inhale 2 puffs into the lungs every 6 (six) hours as needed. For shortness of breath or wheezing      enoxaparin (LOVENOX) 40 MG/0.4ML injection Inject 0.4 mLs (40 mg total) into the skin daily. 30 Syringe 1   ibuprofen (ADVIL,MOTRIN) 600 MG tablet Take 1 tablet (600 mg total) by mouth every 6 (six) hours. 30 tablet 0   oxyCODONE-acetaminophen (PERCOCET/ROXICET) 5-325 MG per tablet Take 1-2 tablets by mouth every 4 (four) hours as needed for severe pain (moderate - severe pain). 30 tablet 0   Prenatal Vit-Fe Fumarate-FA (PRENATAL MULTIVITAMIN) TABS tablet Take 1 tablet by mouth daily at 12 noon.     pseudoephedrine (SUDAFED) 120 MG 12 hr tablet Take 120 mg by mouth at bedtime as needed for congestion.     sertraline (ZOLOFT) 25 MG tablet Take 1 tablet (25 mg total) by mouth  daily. 30 tablet 1   No current facility-administered medications on file prior to visit.    Past Surgical History:  Procedure Laterality Date   ANTERIOR CRUCIATE LIGAMENT REPAIR     BREAST BIOPSY Left    neg   CRANIECTOMY FOR EXCISION OF ACOUSTIC NEUROMA     HERNIA REPAIR  10/2010   umb hernia    Allergies  Allergen Reactions   Diphenhydramine Hcl Hives    BP 122/80   Ht 5' 3.5" (1.613 m)   Wt 143 lb (64.9 kg)   BMI 24.93 kg/m       No data to display              No data to display              Objective:  Physical Exam:  Gen: NAD, comfortable in exam room  Hip, Right:  No obvious rash, erythema, ecchymosis, or edema. Passive Log Roll equivalent b/l without restriction. ROM full in all directions; Strength 5/5 in IR/ER/Flex/Ext/Abd/Add. Pelvic alignment unremarkable to inspection and palpation. Non-antalgic gait without trendelenburg / unsteadiness. Greater trochanter noted to have tenderness to palpation. No tenderness over piriformis. No SI joint tenderness and normal minimal SI movement.  Provocative Testing:    - FABER/FADIR test: POS   - Ober's test: NEG    -  Thomas test: NEG   - Trendelenburg test: NEG   - Ely's test: NEG   - Hop test: NEG Ultrasound of Right Hip  The femoral head is noted to have slightly irregular contour  At the posterior superior facet of the greater trochanter there is a mild spur noted right at the junction of the gluteus medius and minimus with some noted hypoechoic changes around the affected area.  The gluteus medius is well-visualized, it appears normal in echogenicity with no evidence of tears or atrophy.  There is noted hypoechogenicity over attachment of the gluteus medius on the greater trochanter   The gluteus minimus tendon also shows normal echogenicity and structure, there is hypoechoic changes consistent with fluid accumulation at its insertion.   The trochanteric bursa appears inflamed with hypoechoic  changes.  The piriformis is noted to have some hypoechoic changes at its insertion proximally near the posterior medical facet of the greater trochanter as well at the superior aspect.  Labrum is well visualized and there is some minor hypoechoic change without a larger tear noted in mid substance  Impression Notable fluid accumulation over the greater trochanter consistent with inflammation of the abductor muscles including the gluteus medius, minimus as well as piriformis.  Significant femoral spur at lateral greater trochanter which could be causing the changes.  Ultrasound and interpretation by Brenton Grills MD and Sibyl Parr. Fields, MD    Assessment & Plan:  1. 1. Pain of right hip - Patient's pain is likely related to microtrauma of the abductor muscles including the gluteus medius, minimus as well as the piriformis.  Patient would benefit from decreased activity as well as systemic anti-inflammatories.  Patient would also benefit from ice massages over her affected area near the greater trochanter.  Will do meloxicam daily for the next 2 to 3 weeks.  Will also do some hip abduction exercises though with less weight.  Patient to trial conservative therapy for the next 4 to 6 weeks and then follow-up.  If no improvement in follow-up then we will do steroid injection. - [place] LIMITED JOINT SPACE STRUCTURES LOW RIGHT; Future  Brenton Grills MD, PGY-4  Sports Medicine Fellow [facility]  I observed and examined the patient with the [facility] resident and agree with assessment and plan.  Note reviewed and modified by me. Sterling Big, MD

## 2023-10-21 ENCOUNTER — Other Ambulatory Visit: Payer: Self-pay | Admitting: Family Medicine

## 2023-10-21 DIAGNOSIS — Z1231 Encounter for screening mammogram for malignant neoplasm of breast: Secondary | ICD-10-CM

## 2023-11-09 ENCOUNTER — Ambulatory Visit: Payer: BC Managed Care – PPO | Admitting: Family Medicine

## 2023-11-09 ENCOUNTER — Encounter: Payer: Self-pay | Admitting: Family Medicine

## 2023-11-09 VITALS — BP 110/78 | Ht 63.0 in | Wt 140.0 lb

## 2023-11-09 DIAGNOSIS — M25551 Pain in right hip: Secondary | ICD-10-CM | POA: Diagnosis not present

## 2023-11-09 MED ORDER — MELOXICAM 15 MG PO TABS: 15.0000 mg | ORAL_TABLET | Freq: Every day | ORAL | 0 refills | Status: DC

## 2023-11-09 NOTE — Progress Notes (Addendum)
PCP: Sigmund Hazel, MD  Chief Complaint: Hip pain f/u Subjective:   HPI: Patient is a 47 y.o. female here for follow-up of her hip pain.  Patient states that she has had improvement of her trochanteric bursitis with the anti-inflammatories as well as the abduction exercises.  Patient notes that she has had no improvement of the pain on the inner groin area/hip joint area.  Patient states that she was in [place] last week and the pain started to flareup and it was difficult for her to walk.  Patient notes that sometimes she feels unstable but most the time she is not.  Patient is wondering if there is anything that she can do for the pain of the hip joint itself.   Past Medical History:  Diagnosis Date   Acoustic neuroma (HCC)    hx of   Asthma    Clotting disorder (HCC)    Factor 5 Leiden mutation, heterozygous (HCC)    on heparin   Headache(784.0)    with neuroma   Infection     Current Outpatient Medications on File Prior to Visit  Medication Sig Dispense Refill   albuterol (PROVENTIL HFA;VENTOLIN HFA) 108 (90 BASE) MCG/ACT inhaler Inhale 2 puffs into the lungs every 6 (six) hours as needed. For shortness of breath or wheezing      enoxaparin (LOVENOX) 40 MG/0.4ML injection Inject 0.4 mLs (40 mg total) into the skin daily. 30 Syringe 1   ibuprofen (ADVIL,MOTRIN) 600 MG tablet Take 1 tablet (600 mg total) by mouth every 6 (six) hours. 30 tablet 0   oxyCODONE-acetaminophen (PERCOCET/ROXICET) 5-325 MG per tablet Take 1-2 tablets by mouth every 4 (four) hours as needed for severe pain (moderate - severe pain). 30 tablet 0   Prenatal Vit-Fe Fumarate-FA (PRENATAL MULTIVITAMIN) TABS tablet Take 1 tablet by mouth daily at 12 noon.     pseudoephedrine (SUDAFED) 120 MG 12 hr tablet Take 120 mg by mouth at bedtime as needed for congestion.     sertraline (ZOLOFT) 25 MG tablet Take 1 tablet (25 mg total) by mouth daily. 30 tablet 1   No current facility-administered medications on file prior to  visit.    Past Surgical History:  Procedure Laterality Date   ANTERIOR CRUCIATE LIGAMENT REPAIR     BREAST BIOPSY Left    neg   CRANIECTOMY FOR EXCISION OF ACOUSTIC NEUROMA     HERNIA REPAIR  10/2010   umb hernia    Allergies  Allergen Reactions   Diphenhydramine Hcl Hives    BP 110/78   Ht 5\' 3"  (1.6 m)   Wt 140 lb (63.5 kg)   BMI 24.80 kg/m       No data to display              No data to display              Objective:  Physical Exam:  Gen: NAD, comfortable in exam room  Hip, Right:  No obvious rash, erythema, ecchymosis, or edema. Passive Log Roll equivalent b/l without restriction. ROM full in all directions; Strength 5/5 in IR/ER/Flex/Ext/Abd/Add. Pelvic alignment unremarkable to inspection and palpation. Non-antalgic gait without trendelenburg / unsteadiness. Greater trochanter without tenderness to palpation. No tenderness over piriformis. No SI joint tenderness and normal minimal SI movement.  Provocative Testing:    - FABER/FADIR test: NEG/POS    Assessment & Plan:  1. 1. Pain of right hip -Patient's had improvement of her trochanteric bursitis, at this time we will continue  with abduction exercises and anti-inflammatories as needed. -Patient's inner thigh/hip joint pain is possibly due to a labral tear which was previously seen on ultrasound.  At this time, we will go ahead and get MRI of the hip to evaluate for suspected labral tear.  Once MRI is done, patient to follow-up approximately 1 week after for possible steroid injection.  Patient will then be referred to orthopedic surgery for possible labral repair if indicated in the near future. -Will also send patient meloxicam for refill at this time given persistence pain.  Patient is understanding and agreeable with plan. - MR HIP RIGHT W CONTRAST; Future - DG FLUORO GUIDED NEEDLE PLC ASPIRATION/INJECTION LOC; Future    Brenton Grills MD, PGY-4  Sports Medicine Fellow [facility]  Addendum:  Patient seen and examined in the office by fellow.   History, exam, plan of care were precepted with me.  Agree with findings as documented in fellow note above.  Darene Lamer, DO, CAQSM

## 2023-11-10 ENCOUNTER — Encounter: Payer: Self-pay | Admitting: Family Medicine

## 2023-11-19 ENCOUNTER — Inpatient Hospital Stay: Admission: RE | Admit: 2023-11-19 | Payer: BC Managed Care – PPO | Source: Ambulatory Visit

## 2023-11-19 ENCOUNTER — Ambulatory Visit
Admission: RE | Admit: 2023-11-19 | Discharge: 2023-11-19 | Disposition: A | Discharge status: Home / Self Care | Payer: BC Managed Care – PPO | Source: Ambulatory Visit | Attending: Family Medicine | Admitting: Family Medicine

## 2023-11-19 ENCOUNTER — Other Ambulatory Visit: Payer: BC Managed Care – PPO

## 2023-11-19 DIAGNOSIS — Z1231 Encounter for screening mammogram for malignant neoplasm of breast: Secondary | ICD-10-CM

## 2023-11-22 ENCOUNTER — Ambulatory Visit
Admission: RE | Admit: 2023-11-22 | Discharge: 2023-11-22 | Disposition: A | Discharge status: Home / Self Care | Payer: BC Managed Care – PPO | Source: Ambulatory Visit | Attending: Family Medicine | Admitting: Family Medicine

## 2023-11-22 ENCOUNTER — Inpatient Hospital Stay
Admission: RE | Admit: 2023-11-22 | Discharge: 2023-11-22 | Disposition: A | Discharge status: Home / Self Care | Payer: BC Managed Care – PPO | Source: Ambulatory Visit | Attending: Family Medicine | Admitting: Family Medicine

## 2023-11-22 DIAGNOSIS — M25551 Pain in right hip: Secondary | ICD-10-CM

## 2023-11-22 MED ORDER — IOPAMIDOL (ISOVUE-M 200) INJECTION 41%
12.0000 mL | Freq: Once | INTRAMUSCULAR | Status: AC
  Administered 2023-11-22: 12 mL via INTRA_ARTICULAR

## 2023-12-07 ENCOUNTER — Ambulatory Visit: Payer: BC Managed Care – PPO | Admitting: Family Medicine

## 2023-12-07 ENCOUNTER — Encounter: Payer: Self-pay | Admitting: Family Medicine

## 2023-12-07 ENCOUNTER — Other Ambulatory Visit: Payer: Self-pay

## 2023-12-07 VITALS — BP 122/82 | Ht 64.0 in | Wt 140.0 lb

## 2023-12-07 DIAGNOSIS — M25551 Pain in right hip: Secondary | ICD-10-CM | POA: Diagnosis not present

## 2023-12-07 DIAGNOSIS — S73191D Other sprain of right hip, subsequent encounter: Secondary | ICD-10-CM | POA: Diagnosis not present

## 2023-12-07 MED ORDER — METHYLPREDNISOLONE ACETATE 40 MG/ML IJ SUSP
40.0000 mg | Freq: Once | INTRAMUSCULAR | Status: AC
  Administered 2023-12-07: 40 mg via INTRA_ARTICULAR

## 2023-12-07 NOTE — Patient Instructions (Signed)

## 2023-12-07 NOTE — Assessment & Plan Note (Signed)
MRI arthrogram and history/exam consistent with labral tear.  She has ongoing symptoms that are affecting her quality of life  Plan: -Reviewed previous visit notes from 11/09/2023 and 09/29/2023 -Reviewed MRI results with her in detail.  Discussed diagnosis and treatment which may include: Watchful waiting versus ultrasound-guided intra-articular hip injection versus referral for surgery.  Patient would like to proceed with ultrasound-guided injection, this was completed in the office today.  Please see procedure note below. -She will continue her exercises as she is doing -Follow-up in 4 to 6 weeks with Dr. Benjiman Core to assess progress.  If no improvement with injection consider referral to orthopedic surgery  PROCEDURE:  Risks & benefits of u/s guided RT hip injection reviewed.  Consent obtained.  Time-out completed.  Patient prepped and draped in the normal fashion. Musculoskeletal ultrasound used to identify appropriate anatomy - RT hip joint well visualized.  After identifying appropriate anatomy, patient positioned & area cleansed with chlorhexidine.  Ethyl chloride spray used to anesthetize the skin.  Solution of 3 mL 1% lidocaine injected under ultrasound guidance for local anesthesia.  After ensuring adequate anesthesia a solution of 3mL 1% lidocaine with 1 mL Depomedrol 40mg /mL injected into the Rt hip joint using a 22-gauge 3.5-inch spinal needle under ultrasound guidance.  Needle well-visualized in the RT hip joint.  Images saved.  Patient tolerated procedure well without any complications.  Area covered with adhesive bandage.  Post-procedure care reviewed.  All questions answered.   Had immediate improvement in symptoms following the procedure due to anesthetic effect.

## 2023-12-07 NOTE — Progress Notes (Signed)
DATE OF VISIT: 12/07/2023        ERVIN Cox DOB: 06-19-1976 MRN: [id number]  CC:  f/u MRI Rt hip  History of present Illness: Terri Cox is a 47 y.o. female who presents for a follow-up visit for MRI results Last seen 11/09/23 by our sports medicine fellow - Dr Lorain Childes Had MRI arthrogram 11/22/23 - Did note some improvement with arthrogram for several hours Since MRI pain relatively unchanged Pain still primarily in anterior hip/groin Using Mobic daily No new symptoms  Medications:  Outpatient Encounter Medications as of 12/07/2023  Medication Sig   albuterol (PROVENTIL HFA;VENTOLIN HFA) 108 (90 BASE) MCG/ACT inhaler Inhale 2 puffs into the lungs every 6 (six) hours as needed. For shortness of breath or wheezing    meloxicam (MOBIC) 15 MG tablet Take 1 tablet (15 mg total) by mouth daily.   sertraline (ZOLOFT) 25 MG tablet Take 1 tablet (25 mg total) by mouth daily.   [EXPIRED] methylPREDNISolone acetate (DEPO-MEDROL) injection 40 mg    No facility-administered encounter medications on file as of 12/07/2023.    Allergies: is allergic to diphenhydramine hcl.  Physical Examination: Vitals: BP 122/82   Ht 5\' 4"  (1.626 m)   Wt 140 lb (63.5 kg)   BMI 24.03 kg/m  GENERAL:  Terri Cox is a 47 y.o. female appearing their stated age, alert and oriented x 3, in no apparent distress.  SKIN: no rashes or lesions, skin clean, dry, intact MSK: Right hip with near full range of motion with pain at terminal flexion and internal rotation.  Negative FABER, positive FADIR.  Mild tenderness palpation over the anterior hip and psoas with some associated hypertonia.  No significant tenderness over the greater trochanter or posterior hip.  Hip strength 5 -/5 throughout Left hip with full range of motion without pain, negative FABER, negative FADIR.  Hip strength 5/5 throughout. Walking without a limp Neurovascular intact distally  Radiology: MRI:  MRI arthrogram Rt hip  11/22/23 showing: IMPRESSION: 1. Small anterior labral defect, I favor a small anterior labral tear over labral sulcus. 2. Mild proximal hamstring tendinopathy bilaterally. 3. Mild SI joint arthropathy with low-level marrow edema along the sacral side of the SI joints inferiorly. *Images personally reviewed and interpreted by me today.  Images reviewed with patient during the visit today.  Abnormality of the labrum appears most consistent with small labral tear, which also correlates with patient's exam and history  Assessment & Plan Tear of right acetabular labrum, subsequent encounter MRI arthrogram and history/exam consistent with labral tear.  She has ongoing symptoms that are affecting her quality of life  Plan: -Reviewed previous visit notes from 11/09/2023 and 09/29/2023 -Reviewed MRI results with her in detail.  Discussed diagnosis and treatment which may include: Watchful waiting versus ultrasound-guided intra-articular hip injection versus referral for surgery.  Patient would like to proceed with ultrasound-guided injection, this was completed in the office today.  Please see procedure note below. -She will continue her exercises as she is doing -Follow-up in 4 to 6 weeks with Dr. Benjiman Core to assess progress.  If no improvement with injection consider referral to orthopedic surgery  PROCEDURE:  Risks & benefits of u/s guided RT hip injection reviewed.  Consent obtained.  Time-out completed.  Patient prepped and draped in the normal fashion. Musculoskeletal ultrasound used to identify appropriate anatomy - RT hip joint well visualized.  After identifying appropriate anatomy, patient positioned & area cleansed with chlorhexidine.  Ethyl chloride spray used to anesthetize  the skin.  Solution of 3 mL 1% lidocaine injected under ultrasound guidance for local anesthesia.  After ensuring adequate anesthesia a solution of 3mL 1% lidocaine with 1 mL Depomedrol 40mg /mL injected into the Rt hip joint  using a 22-gauge 3.5-inch spinal needle under ultrasound guidance.  Needle well-visualized in the RT hip joint.  Images saved.  Patient tolerated procedure well without any complications.  Area covered with adhesive bandage.  Post-procedure care reviewed.  All questions answered.   Had immediate improvement in symptoms following the procedure due to anesthetic effect.  Patient expressed understanding & agreement with above.  Encounter Diagnosis  Name Primary?   Tear of right acetabular labrum, subsequent encounter Yes    Orders Placed This Encounter  Procedures   [place] LIMITED JOINT SPACE STRUCTURES LOW RIGHT

## 2024-02-28 ENCOUNTER — Other Ambulatory Visit: Payer: Self-pay | Admitting: Family Medicine

## 2024-03-06 ENCOUNTER — Ambulatory Visit: Payer: Self-pay | Admitting: Family Medicine

## 2024-03-06 ENCOUNTER — Other Ambulatory Visit: Payer: Self-pay

## 2024-03-06 ENCOUNTER — Encounter: Payer: Self-pay | Admitting: Family Medicine

## 2024-03-06 VITALS — BP 124/72 | Ht 64.0 in | Wt 143.0 lb

## 2024-03-06 DIAGNOSIS — S73191D Other sprain of right hip, subsequent encounter: Secondary | ICD-10-CM

## 2024-03-06 MED ORDER — METHYLPREDNISOLONE ACETATE 40 MG/ML IJ SUSP
40.0000 mg | Freq: Once | INTRAMUSCULAR | Status: AC
  Administered 2024-03-06: 40 mg via INTRA_ARTICULAR

## 2024-03-06 MED ORDER — MELOXICAM 15 MG PO TABS: 15.0000 mg | ORAL_TABLET | Freq: Every day | ORAL | 0 refills | Status: DC | PRN

## 2024-03-06 NOTE — Progress Notes (Signed)
 DATE OF VISIT: 03/06/2024        Terri Cox DOB: 19-Apr-1976 MRN: [id number]  CC:  f/u Rt hip  History of present Illness: Terri Cox is a 48 y.o. female who presents for a follow-up visit for Rt hip pain  Last seen by me 12/07/23 for Rt hip u/s guided cortisone injection for labral tear Was doing well up until about 2 weeks ago when pain started to return No new injury/trauma Pain in the front of the hip Doing Pilates 1-2 times/wk Also doing [employer] 1-2 times/wk for cardio Using Mobic prn - needs a refill No prior PT - interested in starting this  Medications:  Outpatient Encounter Medications as of 03/06/2024  Medication Sig   albuterol (PROVENTIL HFA;VENTOLIN HFA) 108 (90 BASE) MCG/ACT inhaler Inhale 2 puffs into the lungs every 6 (six) hours as needed. For shortness of breath or wheezing    meloxicam (MOBIC) 15 MG tablet Take 1 tablet (15 mg total) by mouth daily as needed for pain.   sertraline (ZOLOFT) 25 MG tablet Take 1 tablet (25 mg total) by mouth daily.   [DISCONTINUED] meloxicam (MOBIC) 15 MG tablet Take 1 tablet (15 mg total) by mouth daily.   [EXPIRED] methylPREDNISolone acetate (DEPO-MEDROL) injection 40 mg    No facility-administered encounter medications on file as of 03/06/2024.    Allergies: is allergic to diphenhydramine hcl.  Physical Examination: Vitals: BP 124/72   Ht 5\' 4"  (1.626 m)   Wt 143 lb (64.9 kg)   BMI 24.55 kg/m  GENERAL:  Terri Cox is a 48 y.o. female appearing their stated age, alert and oriented x 3, in no apparent distress.  SKIN: no rashes or lesions, skin clean, dry, intact MSK:  Hip: Right hip with near full range of motion with pain at terminal flexion and internal rotation.  Negative FABER, positive FADIR.  Mild tenderness palpation over the anterior hip and psoas with some associated hypertonia.  No tenderness over the greater trochanter or the posterior hip.  Hip strength 5 -/5 throughout Left hip with full  range of motion without pain, negative FABER, negative FADIR, hamstring 5/5 throughout Walking without a limp Neurovascular intact distally  Assessment & Plan Tear of right acetabular labrum, subsequent encounter Recurrent right anterior hip pain with acute flare of pain.  MRI arthrogram showing labral tear.  Had significant improvement status post ultrasound-guided cortisone injection 12/07/2023, but return of pain approximately 2 weeks ago  Plan: -Diagnosis and treatment discussed.  She would be a candidate for repeat injection.  She would like to proceed with this today.  Did advise that with recurrent injections there is a risk of further degradation of the joint, as well as the potential that her body could build tolerance against the steroids.  If she has return of symptoms after the second injection, would consider potentially trial of PRP versus referral to surgeon to discuss surgical intervention.  She expressed understanding and agreement -Refill of Mobic was given today. -Referral to PT was given.  She has been trying to get connected with [facility] over the past several weeks, but having difficulty.  Referral to [facility] given today.  If she does get scheduled with [facility] she can reach out for a referral if needed. -She will continue her Pilates and [employer] as she is doing -Follow-up 4 to 6 weeks if no improvement, sooner as needed  PROCEDURE:  Risks & benefits of u/s guided RT hip injection reviewed.  Consent obtained.  Time-out completed.  Patient prepped and draped in the normal fashion. Musculoskeletal ultrasound used to identify appropriate anatomy - RT hip joint well visualized.  After identifying appropriate anatomy, patient positioned & area cleansed with chlorhexidine.  Ethyl chloride spray used to anesthetize the skin.  Solution of 3 mL 1% lidocaine injected under ultrasound guidance for local anesthesia.  After ensuring adequate anesthesia a solution of 3mL 1%  lidocaine with 1 mL Depomedrol 40mg /mL injected into the Rt hip joint using a 22-gauge 3.5-inch spinal needle under ultrasound guidance.  Needle well-visualized in the RT hip joint.  Images saved.  Patient tolerated procedure well without any complications.  Area covered with adhesive bandage.  Post-procedure care reviewed.  All questions answered.    Had immediate improvement in symptoms following the procedure due to anesthetic effect.   Patient expressed understanding & agreement with above.  Encounter Diagnosis  Name Primary?   Tear of right acetabular labrum, subsequent encounter Yes    Orders Placed This Encounter  Procedures   [place] LIMITED JOINT SPACE STRUCTURES LOW RIGHT   Ambulatory referral to Physical Therapy

## 2024-03-06 NOTE — Patient Instructions (Signed)

## 2024-04-03 ENCOUNTER — Other Ambulatory Visit: Payer: Self-pay | Admitting: Family Medicine

## 2024-10-24 ENCOUNTER — Other Ambulatory Visit: Payer: Self-pay | Admitting: Family Medicine

## 2024-10-24 DIAGNOSIS — Z1231 Encounter for screening mammogram for malignant neoplasm of breast: Secondary | ICD-10-CM

## 2024-11-15 ENCOUNTER — Other Ambulatory Visit (HOSPITAL_BASED_OUTPATIENT_CLINIC_OR_DEPARTMENT_OTHER): Payer: Self-pay

## 2024-11-15 ENCOUNTER — Encounter (HOSPITAL_BASED_OUTPATIENT_CLINIC_OR_DEPARTMENT_OTHER): Payer: Self-pay

## 2024-11-15 DIAGNOSIS — Z13820 Encounter for screening for osteoporosis: Secondary | ICD-10-CM

## 2024-11-20 ENCOUNTER — Ambulatory Visit
Admission: RE | Admit: 2024-11-20 | Discharge: 2024-11-20 | Disposition: A | Discharge status: Home / Self Care | Source: Ambulatory Visit | Attending: Family Medicine | Admitting: Family Medicine

## 2024-11-20 DIAGNOSIS — Z1231 Encounter for screening mammogram for malignant neoplasm of breast: Secondary | ICD-10-CM

## 2024-12-19 ENCOUNTER — Other Ambulatory Visit: Payer: Self-pay

## 2024-12-19 MED ORDER — MELOXICAM 15 MG PO TABS: 15.0000 mg | ORAL_TABLET | Freq: Every day | ORAL | 0 refills | Status: AC | PRN

## 2024-12-26 ENCOUNTER — Ambulatory Visit: Admitting: Family Medicine

## 2024-12-26 ENCOUNTER — Other Ambulatory Visit: Payer: Self-pay

## 2024-12-26 ENCOUNTER — Encounter: Payer: Self-pay | Admitting: Family Medicine

## 2024-12-26 VITALS — BP 122/72 | Ht 63.0 in | Wt 138.0 lb

## 2024-12-26 DIAGNOSIS — M25511 Pain in right shoulder: Secondary | ICD-10-CM | POA: Diagnosis not present

## 2024-12-26 DIAGNOSIS — G8929 Other chronic pain: Secondary | ICD-10-CM

## 2024-12-26 DIAGNOSIS — M7521 Bicipital tendinitis, right shoulder: Secondary | ICD-10-CM

## 2024-12-26 DIAGNOSIS — M7061 Trochanteric bursitis, right hip: Secondary | ICD-10-CM

## 2024-12-26 DIAGNOSIS — S46001A Unspecified injury of muscle(s) and tendon(s) of the rotator cuff of right shoulder, initial encounter: Secondary | ICD-10-CM | POA: Diagnosis not present

## 2024-12-26 MED ORDER — METHYLPREDNISOLONE ACETATE 40 MG/ML IJ SUSP
40.0000 mg | Freq: Once | INTRAMUSCULAR | Status: AC
  Administered 2024-12-26: 40 mg via INTRA_ARTICULAR

## 2024-12-26 NOTE — Patient Instructions (Addendum)
 Today you received an injection with a corticosteroid (aka: cortisone injection). This injection is usually done in response to pain and inflammation. There is some numbing medicine (Lidocaine ) in the shot, so the injected area may be numb and feel really good for the next couple of hours. The numbing medicine usually wears off in 2-3 hours, and then your pain level may be back to where it was before the injection until the cortisone starts working.    The actually benefit from the steroid injection is usually noticed within 3-5 days, but may take up to 14 days. You may actually experience a small (as in 10%) INCREASE in pain in the first 24 hours---that is common.  Things to watch out for that you should contact us  or a health care provider urgently would include: 1. Unusual (as in more than 10%) increase in pain 2. New fever > 101.5 3. New swelling or redness of the injected area. 4. Streaking of red lines around the area injected.  Do not hesitate to call or reach out with any questions or concerns.   Follow-up in 2 weeks for greater trochanteric injection Pursue therapy for right rotator cuff and right biceps injury

## 2024-12-26 NOTE — Progress Notes (Cosign Needed Addendum)
 " PCP: Cleotilde Planas, MD  Subjective:   HPI: Patient is a 48 y.o. female here for right shoulder pain and right hip pain.  Patient has a known history of right greater trochanteric bursitis.  She has been doing physical therapy, dry needling which has been helpful.  She still has some persistent pain with this.  She denies any new injuries.  Pain is still localized to the right lateral hip.  Patient states that her right shoulder has been painful greater than a year.  She works out and [employer] often.  She notices pain with many movements and feels a dull ache at the anterior part of her shoulder most of the time.  She takes meloxicam  as needed.  She has not done any therapy or had any imaging of her shoulder.  Past Medical History:  Diagnosis Date   Acoustic neuroma (HCC)    hx of   Asthma    Clotting disorder    Factor 5 Leiden mutation, heterozygous    on heparin   Headache(784.0)    with neuroma   Infection     Medications Ordered Prior to Encounter[1]  BP 122/72   Ht 5' 3 (1.6 m)   Wt 138 lb (62.6 kg)   BMI 24.45 kg/m        Objective:   Physical Exam:  Gen: NAD, comfortable in exam room Right shoulder Inspection: No erythema, edema or warmth Palpation: Tenderness to palpation over the long head of the biceps ROM: Full flexion Special Tests: Pain with empty can testing, pain with Yergason's, pain with O'Brien's, strength of the rotator cuff is maintained, positive crossover test Neuro: Neurovascular intact distally  Right hip Palpation: Tender at the right greater trochanter ROM: Full hip flexion, internal and internal rotation without pain Special Tests: Pain with resisted abduction and adduction, negative FABER and FADIR Neuro: Neurovascular intact distally  Limited ultrasound of the right shoulder: Date: 12/26/2024 Indication: Right shoulder pain Findings: -Biceps tendon with increased fluid in the tendon sheath, associated hypoechoic change.  Likely  calcification present in the proximal biceps tendon -Initial incorrect labeling of series ultrasound #1, images 7, 8, 9, 10, 11, 12: These were initially labeled as supraspinatus, they are actually the subscapularis.  Updated, corrected labeling noted in series ultrasound #2 which correlates to images 1, 2, 3, 4, 5, 6.  Partial tearing tearing present at the subscapularis insertion, bursal sided, chronic appearing.  Some associated hypoechoic change as well. - Geyser sign present at the [facility] joint with internal calcification calcification within the joint capsule, area non-tender with sono-palpation - Initial incorrect labeling of series ultrasound #1, images 17, 18, 19: These were initially labeled as subscapularis, they are actually the supraspinatus.  Updated, corrected labeling noted in series ultrasound #2 which correlates to images 7, 8, 9. Supraspinatus with partial articular sided tearing, some associated hypoechoic change. 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 female who was seen today for the following: 1. Chronic right shoulder pain (  Primary) 2. Biceps tendinitis of right upper extremity 3. Injury of right rotator cuff, initial encounter 4. Right Greater Trochanteric Bursitis  - US  COMPLETE JOINT SPACE STRUCTURES UP RIGHT; Future - methylPREDNISolone  acetate (DEPO-MEDROL )  injection 40 mg - Patient exam and ultrasound significant for some chronic tearing of right rotator cuff as well as biceps tendinitis/adenopathy - Discussed treatment options with patient and she elected to pursue right biceps tendon sheath injection under ultrasound guidance. - She tolerated procedure well - She will attend some physical therapy for her biceps tendinitis and rotator cuff injury - In terms of her right greater trochanteric pain, she did want a pursue injection but due to Time constraints, we were unable to fully address today. - She will follow-up in 2 weeks for re-evaluation of the shoulder and further evaluation of the right hip and possible right greater trochanteric injection  Procedure: Ultrasound-guided corticosteroid injection of right biceps tendon sheath Indications: pain at anterior shoulder and + biceps testing  Patient: Terri Cox [birth year] Procedure date: 12/26/2024  Risks, benefits, and alternatives were discussed with the patient.  Verbal and written, informed consent obtained.  Time-out conducted.  Noted no overlying erythema, induration, or other signs of local infection.  Skin prepped in a sterile fashion using ethyl alcohol.  Needle: 25 ga 1/12 Injection: 1/1 mixture of Lidocaine  1% w/out epi and Kenalog 40 mg/mL With sterile technique the right biceps tendon sheath was injected using the lateral to medial approach under ultrasound guidance.  Needle well-visualized within the biceps tendon sheath.  The procedure was completed without difficulty. Patient tolerated well and no immediate complications.  Summary: Successful ultrasound-guided right biceps tendon sheath injection  Follow-up/Education:   Return in about 2 weeks (around 01/09/2025).   May return sooner as needed and encouraged to call/e-mail for additional questions or  worsening symptoms in the interim.  Krystal Lowing, DO Sports Medicine Fellow 12/26/2024 12:58 PM     [1]   Current Outpatient Medications on File Prior to Visit  Medication Sig Dispense Refill   albuterol  (PROVENTIL  HFA;VENTOLIN  HFA) 108 (90 BASE) MCG/ACT inhaler Inhale 2 puffs into the lungs every 6 (six) hours as needed. For shortness of breath or wheezing      meloxicam  (MOBIC ) 15 MG tablet Take 1 tablet (15 mg total) by mouth daily as needed for pain. 30 tablet 0   sertraline  (ZOLOFT ) 25 MG tablet Take 1 tablet (25 mg total) by mouth daily. 30 tablet 1   No current facility-administered medications on file prior to visit.   "

## 2025-01-09 ENCOUNTER — Ambulatory Visit: Admitting: Family Medicine

## 2025-01-09 ENCOUNTER — Other Ambulatory Visit: Payer: Self-pay

## 2025-01-09 ENCOUNTER — Encounter: Payer: Self-pay | Admitting: Family Medicine

## 2025-01-09 VITALS — BP 108/70 | Ht 63.0 in | Wt 138.0 lb

## 2025-01-09 DIAGNOSIS — M7521 Bicipital tendinitis, right shoulder: Secondary | ICD-10-CM

## 2025-01-09 DIAGNOSIS — G8929 Other chronic pain: Secondary | ICD-10-CM | POA: Diagnosis not present

## 2025-01-09 DIAGNOSIS — M7061 Trochanteric bursitis, right hip: Secondary | ICD-10-CM

## 2025-01-09 DIAGNOSIS — S46001A Unspecified injury of muscle(s) and tendon(s) of the rotator cuff of right shoulder, initial encounter: Secondary | ICD-10-CM | POA: Diagnosis not present

## 2025-01-09 DIAGNOSIS — M25551 Pain in right hip: Secondary | ICD-10-CM

## 2025-01-09 DIAGNOSIS — M25511 Pain in right shoulder: Secondary | ICD-10-CM | POA: Diagnosis not present

## 2025-01-09 MED ORDER — METHYLPREDNISOLONE ACETATE 40 MG/ML IJ SUSP
40.0000 mg | Freq: Once | INTRAMUSCULAR | Status: AC
  Administered 2025-01-09: 40 mg via INTRA_ARTICULAR

## 2025-01-09 NOTE — Progress Notes (Unsigned)
 DATE OF VISIT: 01/09/2025        Terri Cox DOB: [birth year] MRN: [id number]  Discussed the use of AI scribe software for clinical note transcription with the patient, who gave verbal consent to proceed.  History of Present Illness Terri Cox is a 49 year old female with right shoulder biceps tendinitis and rotator cuff tear who presents for follow-up evaluation of right shoulder and evaluation of right hip pain.  Right Shoulder Pain and Dysfunction: - Approximately 80% improvement in pain following corticosteroid injection into biceps tendon sheath at last visit, with near immediate relief - Able to sleep comfortably on the affected side - Residual discomfort with certain activities, including flies and specific arm movements, particularly with abduction and flexion - No new medications or allergies since previous visit  Right Hip Pain and Limited Mobility: -Prior history of right hip pain with MRI showing acetabular labral tear 11/22/2023.  Received intra-articular injection 12/07/2023 with good relief.  Repeat injection 03/06/2024 with good relief as well of anterior/groin hip pain -Has been doing physical therapy and dry needling for ongoing lateral hip pain.  Significant improvement after physical therapy and dry needling - Persistent restricted mobility and pain with activities such as sitting cross-legged, butterfly stretches, and figure-four stretches - Intermittent labral discomfort that is self-limited - Most consistent pain localized laterally over the hip, does not radiate - Previous intra-articular hip injections targeted the joint and labrum, but not the bursa     Medications:  Outpatient Encounter Medications as of 01/09/2025  Medication Sig   albuterol  (PROVENTIL  HFA;VENTOLIN  HFA) 108 (90 BASE) MCG/ACT inhaler Inhale 2 puffs into the lungs every 6 (six) hours as needed. For shortness of breath or wheezing    meloxicam  (MOBIC ) 15 MG tablet Take 1 tablet (15 mg  total) by mouth daily as needed for pain.   sertraline  (ZOLOFT ) 25 MG tablet Take 1 tablet (25 mg total) by mouth daily.   [EXPIRED] methylPREDNISolone  acetate (DEPO-MEDROL ) injection 40 mg    No facility-administered encounter medications on file as of 01/09/2025.    Allergies: is allergic to diphenhydramine hcl.  Physical Examination: Vitals: BP 108/70   Ht 5' 3 (1.6 m)   Wt 138 lb (62.6 kg)   BMI 24.45 kg/m  GENERAL:  Terri Cox is a 49 y.o. female appearing their stated age, alert and oriented x 3, in no apparent distress.  SKIN: no rashes or lesions, skin clean, dry, intact MSK: Shoulder: Right shoulder with full range of motion without pain.  Mild tenderness palpation over the bicipital groove, no tenderness over the [facility] joint or the greater tuberosity.  Positive speeds, negative empty can, negative Hawkins, negative Neer, negative belly press.  Rotator cuff strength 5/5 throughout.  Left shoulder with full range of motion without pain, weakness, instability Hip: Right hip with decreased internal rotation with pain along the lateral hip.  Good external rotation.  Positive FADIR, negative FABER.  Mild tenderness palpation over anterior hip, tender to palpation over the greater trochanter.  Hip strength 5-/5 throughout.  Left hip with full range of motion without pain, weakness, instability.  Left hip strength 5-/5.   Walking without a limp Neurovascular intact distally  Radiology: Limited MSK ultrasound right hip Date: 11/09/2025 Indication: Right lateral hip pain Findings: - Normal-appearing glute minimus attachment on the anterior facet of the greater trochanter - Glute medius with hypoechoic change at insertion on the greater trochanter with associated partial tearing.  No complete tear is noted.  No  increased Doppler flow or hyperemia. - Small amount of fluid in the greater trochanteric bursa  Impression: - Glute medius tendinopathy with associated partial tear -  Greater trochanteric bursitis Images and interpretation completed by Rainell Cedar, DO    MRI arthrogram Rt hip 11/22/23 showing: IMPRESSION: 1. Small anterior labral defect, I favor a small anterior labral tear over labral sulcus. 2. Mild proximal hamstring tendinopathy bilaterally. 3. Mild SI joint arthropathy with low-level marrow edema along the sacral side of the SI joints inferiorly. *Images personally reviewed and interpreted by me 12/07/23.  Images reviewed with patient during the visit.  Abnormality of the labrum appears most consistent with small labral tear, which also correlates with patient's exam and history Assessment & Plan Right hip partial gluteus medius tear with trochanteric bursitis Chronic right hip pain and restricted mobility due to partial gluteus medius tear and moderate trochanteric bursitis from overuse. Prior therapies provided some improvement, but persistent pain and limited range of motion remain. - Performed ultrasound-guided corticosteroid injection into the right trochanteric bursa as noted below - Advised limited activity for 2-3 days post-injection; return to exercise class by Thursday is acceptable. - Provided anticipatory guidance on expected soreness for 1-2 days, loss of numbing effect by evening, and maximal benefit within 3-5 days, up to 2 weeks. - Instructed to monitor for improvement by the end of the month; if no improvement, the injection may not have been effective. - Discussed risks including bleeding, infection, and transient soreness - recommend return to physical therapy.  Referral to Wyvonna Norma with [facility] requested. - Follow-up 4 to 6 weeks to reassess, sooner as needed  PROCEDURE:  Risks & benefits of right hip ultrasound-guided greater trochanteric cortisone injection reviewed. Consent obtained. Time-out completed. Patient prepped and draped in the normal fashion.  Ultrasound used to identify appropriate anatomy.  Increased fluid in the  greater troches bursa noted as documented above.  Area cleansed with chlorhexidine. Ethyl chloride spray used to anesthetize the skin. Solution of 4 mL 1% lidocaine  without epinephrine with 1 mL methylprednisolone  (Depo-medrol ) 40mg /mL injected into the right greater trochanteric bursa using a 22-gauge 3.5-inch needle via the lateral approach over point of maximal tenderness using ultrasound guidance.  Needle position in the greater trochanteric bursa was noted.. Patient tolerated procedure well without any complications. Area covered with adhesive bandage. Post-procedure care reviewed, all questions answered.   Right shoulder biceps tendinitis and partial rotator cuff tear Right shoulder pain due to partial rotator cuff tear and biceps tendinitis. Previous corticosteroid injection provided 80% relief with residual discomfort on specific movements. Strength preserved and inflammation controlled. Continued physical therapy expected to address remaining symptoms. - Referred to physical therapy for ongoing rehabilitation and strengthening. - Follow-up 4 to 6 weeks to reassess, sooner as needed     Patient expressed understanding & agreement with above.  Encounter Diagnoses  Name Primary?   Pain of right hip Yes   Greater trochanteric bursitis of right hip    Biceps tendinitis of right upper extremity    Injury of right rotator cuff, initial encounter    Chronic right shoulder pain     Orders Placed This Encounter  Procedures   US  LIMITED JOINT SPACE STRUCTURES LOW RIGHT   Ambulatory referral to Physical Therapy     Contains text generated by Abridge.

## 2025-01-10 NOTE — Patient Instructions (Signed)

## 2025-01-22 ENCOUNTER — Other Ambulatory Visit: Payer: Self-pay | Admitting: Family Medicine

## 2025-01-31 NOTE — Therapy (Unsigned)
 " OUTPATIENT PHYSICAL THERAPY SHOULDER EVALUATION   Patient Name: Terri Cox MRN: [id number] DOB:06/06/1976, 49 y.o., female Today's Date: 02/01/2025  END OF SESSION:  PT End of Session - 02/01/25 0855     Visit Number 1    Number of Visits 8    Date for Recertification  03/29/25    Authorization Type [employer]    PT Start Time [phone]    PT Stop Time 0930    PT Time Calculation (min) 39 min    Activity Tolerance Patient tolerated treatment well    Behavior During Therapy WFL for tasks assessed/performed          Past Medical History:  Diagnosis Date   Acoustic neuroma (HCC)    hx of   Asthma    Clotting disorder    Factor 5 Leiden mutation, heterozygous    on heparin   Headache(784.0)    with neuroma   Infection    Past Surgical History:  Procedure Laterality Date   ANTERIOR CRUCIATE LIGAMENT REPAIR     BREAST BIOPSY Left    neg   CRANIECTOMY FOR EXCISION OF ACOUSTIC NEUROMA     HERNIA REPAIR  10/2010   umb hernia   Patient Active Problem List   Diagnosis Date Noted   Pain of right hip 12/07/2023   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 Rainell Cox  REFERRING DIAG: M75.21 (ICD-10-CM) - Biceps tendinitis of right upper extremity S46.001A (ICD-10-CM) - Injury of right rotator cuff, initial encounter M25.511,G89.29 (ICD-10-CM) - Chronic right  shoulder pain  THERAPY DIAG:  Biceps tendinitis of right upper extremity  Injury of right rotator cuff, initial encounter  Chronic right shoulder pain  Pain in right hip  Rationale for Evaluation and Treatment: Rehabilitation  ONSET DATE: Chronic  SUBJECTIVE:                                                                                                                                                                                      SUBJECTIVE STATEMENT: Patient is a 49 year old female with  chief complaint of pain in the right shoulder which has been ongoing for several months.  She is familiar to me from community Pilates classes.  She reports pain initially beginning years ago while she was lifting weights but the pain resolved and did not get that severe until fairly recently where it worsened and became more consistent when she was unable to comfortably sleep on her right side.  Dr. Teressa did a musculoskeletal ultrasound see below She currently has no pain at rest but notices minimal pain disturbance while in Pilates class although she often works through the pain.  She does not workout at home but does Terri Cox once a week and does take a walk once or twice a week.  She denies sensory disturbance or radiating pain.  Weakness in her right shoulder is subtle.  She denies crepitus locking or clicking of her shoulder.  She also has some chronic hip pain which she would like to have work done as well if possible Hand dominance: Right  PERTINENT HISTORY: HNP cervical L knee surgeries   PAIN:  Are you having pain? Yes: NPRS scale: 2/10 at its worst 3/10 Pain location: Rt ant superior shoulder  Pain description: sharp. Dull ache  Aggravating factors: Reaching back and out  Relieving factors: position change   PRECAUTIONS: None  RED FLAGS: None   WEIGHT BEARING RESTRICTIONS: No  FALLS:  Has patient fallen in last 6 months? 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  DIAGNOSTIC FINDINGS:  Impression: - Proximal biceps tendinopathy with associated small calcification - Partial bursal sided insertional subscapularis tear with underlying tendinopathy - Mild AC joint degenerative changes with associated calcification within the joint capsule - Partial articular sided supraspinatus tear with underlying tendinopathy - Infraspinatus tendinopathy with signs of chronic tearing    PATIENT SURVEYS:  Quick Dash: 4.5%  COGNITION: Overall cognitive status: Within functional limits for tasks assessed     SENSATION: WFL  POSTURE: Patient with good resting standing posture  UPPER EXTREMITY ROM:   Active  Right eval Left eval  Shoulder flexion Full end range tension  Full   Shoulder extension    Shoulder abduction Full  Full   Shoulder adduction    Shoulder internal rotation T5 T1  Shoulder external rotation T2 T2  Elbow flexion    Elbow extension    Wrist flexion    Wrist extension    Wrist ulnar deviation    Wrist radial deviation    Wrist pronation    Wrist supination    (Blank rows = not tested)  UPPER EXTREMITY MMT:  MMT Right eval Left eval  Shoulder flexion 4+   Shoulder extension    Shoulder abduction 4+   Shoulder adduction    Shoulder internal rotation 5   Shoulder external rotation 4+ pain    Middle trapezius    Lower trapezius    Elbow flexion    Elbow extension    Wrist flexion  Wrist extension    Wrist ulnar deviation    Wrist radial deviation    Wrist pronation    Wrist supination    Grip strength (lbs)    (Blank rows = not tested)  SHOULDER SPECIAL TESTS: Impingement tests: Painful arc test: negative Rotator cuff assessment: Empty can test: negative  JOINT MOBILITY TESTING:  WNL Tight posterior capsule right shoulder at 45 degrees left  65-70   PALPATION:  Min soreness in Rt supraspinatus and post cuff, infraspinatus                                                                                                                              TREATMENT DATE:   [facility] Treatment:                                                DATE: 02/01/25 Therapeutic Exercise: Sleeper stretch, started at 45 degrees added contract relax Side-lying external rotation, scaption and reverse fly with a 3 pound dumbbell Self Care: Plan of care Home exercise program will integrate hip into plan of care once the doctor signed the certification  PATIENT EDUCATION: Education details: See above Person educated: Patient Education method: Explanation, Demonstration, and Handouts Education comprehension: verbalized understanding and returned demonstration  HOME EXERCISE PROGRAM: Access Code: [email or web address] URL: [email or web address] Date: 02/01/2025 Prepared by: Delon Norma  Exercises - Sleeper Stretch  - 1 x daily - 7 x weekly - 1 sets - 5 reps - 10 hold - Sidelying Shoulder ER with Towel and Dumbbell  - 1 x daily - 7 x weekly - 2 sets - 10 reps - 5 hold - Sidelying Shoulder Scaption  - 1 x daily - 7 x weekly - 2 sets - 10 reps - 5 hold - Sidelying Shoulder Horizontal Abduction  - 1 x daily - 7 x weekly - 2 sets - 10 reps - 5 hold  ASSESSMENT:  CLINICAL IMPRESSION: Patient is a 49 y.o. female who was seen today for physical therapy evaluation and treatment for right shoulder pain.  Patient would like to be seen as well for her right hip will integrate this  into her plan of care with your signature and approval.  OBJECTIVE IMPAIRMENTS: decreased mobility, decreased ROM, decreased strength, increased fascial restrictions, impaired UE functional use, and pain.   ACTIVITY LIMITATIONS: carrying, lifting, reach over head, and sleep  PARTICIPATION LIMITATIONS: community activity  PERSONAL FACTORS: 1 comorbidity: Chronicity of condition additional right sided hip pain as well. are also affecting patient's functional outcome.   REHAB POTENTIAL: Excellent  CLINICAL DECISION MAKING: Stable/uncomplicated  EVALUATION COMPLEXITY: Low   GOALS: Goals reviewed with patient? Yes  SHORT TERM GOALS: Target date:03/01/2025  Patient will be independent with home exercise program for initial shoulder range of motion and strength Baseline: Goal status: INITIAL  2.  Patient will have her right hip assessed with further goals set Baseline:  Goal status: INITIAL  3.  Patient will modify Pilates exercises during class Baseline:  Goal status: INITIAL   LONG TERM GOALS: Target date: 03/29/2025    Patient will be able to complete a Pilates class as usual without modifications for shoulder or hip pain Baseline:  Goal status: INITIAL  2.  Patient will be independent with final HEP upon discharge from PT and report consistent benefit following exercise completion.    Baseline:  Goal status: INITIAL  3.  Patient will be able to show good core stability and motor control with mat level intermediate stabilization exercises Baseline:  Goal status: INITIAL  4.  Patient will be able to report no increased pain with side sleeping Rt side  Baseline:  Goal status: INITIAL  5.  Patient will be able to reach right arm to the upper back without increased pain Baseline:  Goal status: INITIAL  6.  Further goals to be assessed for the hip Baseline:  Goal status: INITIAL  PLAN:  PT FREQUENCY: 1x/week  PT DURATION: 8 weeks  PLANNED INTERVENTIONS: [id number]-  PT Re-evaluation, 97750- Physical Performance Testing, 97110-Therapeutic exercises, 97530- Therapeutic activity, 97112- Neuromuscular re-education, 97535- Self Care, [id number]- Manual therapy, Patient/Family education, Balance training, Taping, Joint mobilization, Cryotherapy, and Moist heat  PLAN FOR NEXT SESSION: Check home exercise program and evaluate right hip   Bridgit Eynon, PT 02/01/2025, 12:38 PM  Delon Norma, PT 02/01/25 3:45 PM Phone: [phone] Fax: [phone]  "

## 2025-02-01 ENCOUNTER — Encounter: Payer: Self-pay | Admitting: Physical Therapy

## 2025-02-01 ENCOUNTER — Ambulatory Visit: Admitting: Physical Therapy

## 2025-02-01 DIAGNOSIS — M25551 Pain in right hip: Secondary | ICD-10-CM

## 2025-02-01 DIAGNOSIS — G8929 Other chronic pain: Secondary | ICD-10-CM | POA: Diagnosis not present

## 2025-02-01 DIAGNOSIS — M25511 Pain in right shoulder: Secondary | ICD-10-CM

## 2025-02-01 DIAGNOSIS — S46001A Unspecified injury of muscle(s) and tendon(s) of the rotator cuff of right shoulder, initial encounter: Secondary | ICD-10-CM

## 2025-02-01 DIAGNOSIS — M7521 Bicipital tendinitis, right shoulder: Secondary | ICD-10-CM | POA: Diagnosis not present

## 2025-02-21 ENCOUNTER — Encounter: Admitting: Physical Therapy

## 2025-02-27 ENCOUNTER — Other Ambulatory Visit (HOSPITAL_BASED_OUTPATIENT_CLINIC_OR_DEPARTMENT_OTHER)

## 2025-02-28 ENCOUNTER — Encounter: Admitting: Physical Therapy

## 2025-03-07 ENCOUNTER — Encounter: Admitting: Physical Therapy

## 2025-03-14 ENCOUNTER — Encounter: Admitting: Physical Therapy

## 2025-03-16 ENCOUNTER — Encounter: Admitting: Physical Therapy
